# Patient Record
Sex: Female | Born: 1995 | Race: White | Hispanic: No | Marital: Single | Smoking: Former smoker
Health system: Southern US, Community
[De-identification: ages and names within clinical notes are randomized; demographics above are authoritative.]

## PROBLEM LIST (undated history)

## (undated) DIAGNOSIS — F32A Depression, unspecified: Secondary | ICD-10-CM

## (undated) DIAGNOSIS — F329 Major depressive disorder, single episode, unspecified: Secondary | ICD-10-CM

## (undated) HISTORY — PX: FRACTURE SURGERY: SHX138

## (undated) HISTORY — PX: WISDOM TOOTH EXTRACTION: SHX21

---
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
## 2015-11-10 ENCOUNTER — Ambulatory Visit
Admission: EM | Admit: 2015-11-10 | Discharge: 2015-11-10 | Disposition: A | Discharge status: Home / Self Care | Payer: BLUE CROSS/BLUE SHIELD | Attending: Family Medicine | Admitting: Family Medicine

## 2015-11-10 ENCOUNTER — Encounter: Payer: Self-pay | Admitting: *Deleted

## 2015-11-10 DIAGNOSIS — N390 Urinary tract infection, site not specified: Secondary | ICD-10-CM

## 2015-11-10 DIAGNOSIS — B3741 Candidal cystitis and urethritis: Secondary | ICD-10-CM | POA: Diagnosis not present

## 2015-11-10 LAB — URINALYSIS COMPLETE WITH MICROSCOPIC (ARMC ONLY)
BILIRUBIN URINE: NEGATIVE
Glucose, UA: NEGATIVE mg/dL
KETONES UR: NEGATIVE mg/dL
NITRITE: NEGATIVE
PROTEIN: NEGATIVE mg/dL
SPECIFIC GRAVITY, URINE: 1.015 (ref 1.005–1.030)
pH: 7 (ref 5.0–8.0)

## 2015-11-10 LAB — PREGNANCY, URINE: PREG TEST UR: NEGATIVE

## 2015-11-10 MED ORDER — FLUCONAZOLE 150 MG PO TABS: ORAL_TABLET | ORAL | Status: DC

## 2015-11-10 MED ORDER — PHENAZOPYRIDINE HCL 200 MG PO TABS: 200.0000 mg | ORAL_TABLET | Freq: Three times a day (TID) | ORAL | Status: DC

## 2015-11-10 MED ORDER — CEPHALEXIN 500 MG PO CAPS: 500.0000 mg | ORAL_CAPSULE | Freq: Two times a day (BID) | ORAL | Status: DC

## 2015-11-10 NOTE — ED Provider Notes (Signed)
CSN: [id number]     Arrival date & time 11/10/15  1713 History   First MD Initiated Contact with Patient 11/10/15 1828     Chief Complaint  Patient presents with  . Dysuria  . Urinary Urgency  . Urinary Frequency  . Fever  . Nausea  . Emesis   (Consider location/radiation/quality/duration/timing/severity/associated sxs/prior Treatment) HPI  20 year old female who presents with dysuria urinary urgency frequency and in satiety. She denies any fever or back pain . About 3 days ago. her symptoms began. [facility] she has had numerous workups for a UTI and has even seen urology who performed a CAT scan but did not find any source telling her that it was most likely from intercourse. He states that she is sexually active at the present time. Denies any vaginal discharge.     History reviewed. No pertinent past medical history. History reviewed. No pertinent past surgical history. History reviewed. No pertinent family history. Social History  Substance Use Topics  . Smoking status: Never Smoker   . Smokeless tobacco: None  . Alcohol Use: No   OB History    No data available     Review of Systems  Constitutional: Positive for activity change. Negative for fever, chills and fatigue.  Genitourinary: Positive for dysuria, urgency, frequency, decreased urine volume and difficulty urinating. Negative for hematuria, flank pain, vaginal bleeding, vaginal discharge, genital sores, vaginal pain, menstrual problem and pelvic pain.  All other systems reviewed and are negative.   Allergies  Review of patient's allergies indicates no known allergies.  Home Medications   Prior to Admission medications   Medication Sig Start Date End Date Taking? Authorizing Provider  cephALEXin (KEFLEX) 500 MG capsule Take 1 capsule (500 mg total) by mouth 2 (two) times daily. 11/10/15   Lutricia FeilWilliam P Joey Hudock, PA-C  fluconazole (DIFLUCAN) 150 MG tablet Take one tab once and repeat in 72  hours. 11/10/15   Lutricia FeilWilliam P Keria Widrig, PA-C  phenazopyridine (PYRIDIUM) 200 MG tablet Take 1 tablet (200 mg total) by mouth 3 (three) times daily. 11/10/15   Lutricia FeilWilliam P Avira Tillison, PA-C   Meds Ordered and Administered this Visit  Medications - No data to display  BP 124/72 mmHg  Pulse 82  Temp(Src) 98.3 F (36.8 C) (Oral)  Resp 16  Ht 5\' 3"  (1.6 m)  Wt 114 lb (51.71 kg)  BMI 20.20 kg/m2  SpO2 100%  LMP 10/27/2015 (Exact Date) No data found.   Physical Exam  Constitutional: She is oriented to person, place, and time. She appears well-developed and well-nourished. No distress.  HENT:  Head: Normocephalic.  Eyes: Conjunctivae are normal. Pupils are equal, round, and reactive to light.  Neck: Normal range of motion. Neck supple.  Pulmonary/Chest: Breath sounds normal. No respiratory distress. She has no wheezes. She has no rales.  Abdominal: Soft. Bowel sounds are normal. She exhibits no distension and no mass. There is no tenderness. There is no rebound and no guarding.  Musculoskeletal: Normal range of motion. She exhibits no edema or tenderness.  Neurological: She is alert and oriented to person, place, and time.  Skin: Skin is warm and dry. She is not diaphoretic.  Psychiatric: She has a normal mood and affect. Her behavior is normal. Judgment and thought content normal.  Nursing note and vitals reviewed.   ED Course  Procedures (including critical care time)  Labs Review Labs Reviewed  URINALYSIS COMPLETEWITH MICROSCOPIC [facility] ONLY) - Abnormal; Notable for the following:  APPearance CLOUDY (*)    Hgb urine dipstick 2+ (*)    Leukocytes, UA 2+ (*)    Bacteria, UA MANY (*)    Squamous Epithelial / LPF 0-5 (*)    All other components within normal limits  URINE CULTURE  PREGNANCY, URINE    Imaging Review No results found.   Visual Acuity Review  Right Eye Distance:   Left Eye Distance:   Bilateral Distance:    Right Eye Near:   Left Eye Near:    Bilateral Near:          MDM   1. UTI (lower urinary tract infection)   2. Candida cystitis    New Prescriptions   CEPHALEXIN (KEFLEX) 500 MG CAPSULE    Take 1 capsule (500 mg total) by mouth 2 (two) times daily.   FLUCONAZOLE (DIFLUCAN) 150 MG TABLET    Take one tab once and repeat in 72 hours.   PHENAZOPYRIDINE (PYRIDIUM) 200 MG TABLET    Take 1 tablet (200 mg total) by mouth 3 (three) times daily.  Plan: 1. Test/x-ray results and diagnosis reviewed with patient 2. rx as per orders; risks, benefits, potential side effects reviewed with patient 3. Recommend supportive treatment with Increase fluids. I've given her both Diflucan and Keflex for UTI and yeast. She is very familiar with all the ways to avoid UTIs. unfortunately she does not like cranberry juice. She will call in 48 hours for results of the cultures and sensitivity. She's not improving I have requested that she return to her primary care physician for follow-up. 4. F/u prn if symptoms worsen or don't improve     Lutricia Feil, PA-C 11/10/15 1922

## 2015-11-10 NOTE — Discharge Instructions (Signed)
Dysuria °Dysuria is pain or discomfort while urinating. The pain or discomfort may be felt in the tube that carries urine out of the bladder (urethra) or in the surrounding tissue of the genitals. The pain may also be felt in the groin area, lower abdomen, and lower back. You may have to urinate frequently or have the sudden feeling that you have to urinate (urgency). Dysuria can affect both men and women, but is more common in women. °Dysuria can be caused by many different things, including: °· Urinary tract infection in women. °· Infection of the kidney or bladder. °· Kidney stones or bladder stones. °· Certain sexually transmitted infections (STIs), such as chlamydia. °· Dehydration. °· Inflammation of the vagina. °· Use of certain medicines. °· Use of certain soaps or scented products that cause irritation. °HOME CARE INSTRUCTIONS °Watch your dysuria for any changes. The following actions may help to reduce any discomfort you are feeling: °· Drink enough fluid to keep your urine clear or pale yellow. °· Empty your bladder often. Avoid holding urine for long periods of time. °· After a bowel movement or urination, women should cleanse from front to back, using each tissue only once. °· Empty your bladder after sexual intercourse. °· Take medicines only as directed by your health care provider. °· If you were prescribed an antibiotic medicine, finish it all even if you start to feel better. °· Avoid caffeine, tea, and alcohol. They can irritate the bladder and make dysuria worse. In men, alcohol may irritate the prostate. °· Keep all follow-up visits as directed by your health care provider. This is important. °· If you had any tests done to find the cause of dysuria, it is your responsibility to obtain your test results. Ask the lab or department performing the test when and how you will get your results. Talk with your health care provider if you have any questions about your results. °SEEK MEDICAL CARE  IF: °· You develop pain in your back or sides. °· You have a fever. °· You have nausea or vomiting. °· You have blood in your urine. °· You are not urinating as often as you usually do. °SEEK IMMEDIATE MEDICAL CARE IF: °· You pain is severe and not relieved with medicines. °· You are unable to hold down any fluids. °· You or someone else notices a change in your mental function. °· You have a rapid heartbeat at rest. °· You have shaking or chills. °· You feel extremely weak. °  °This information is not intended to replace advice given to you by your health care provider. Make sure you discuss any questions you have with your health care provider. °  °Document Released: 03/30/2004 Document Revised: 07/23/2014 Document Reviewed: 02/25/2014 °Elsevier Interactive Patient Education ©2016 [employer] ° °Urinary Tract Infection °Urinary tract infections (UTIs) can develop anywhere along your urinary tract. Your urinary tract is your body's drainage system for removing wastes and extra water. Your urinary tract includes two kidneys, two ureters, a bladder, and a urethra. Your kidneys are a pair of bean-shaped organs. Each kidney is about the size of your fist. They are located below your ribs, one on each side of your spine. °CAUSES °Infections are caused by microbes, which are microscopic organisms, including fungi, viruses, and bacteria. These organisms are so small that they can only be seen through a microscope. Bacteria are the microbes that most commonly cause UTIs. °SYMPTOMS  °Symptoms of UTIs may vary by age and gender of the patient   and by the location of the infection. Symptoms in young women typically include a frequent and intense urge to urinate and a painful, burning feeling in the bladder or urethra during urination. Older women and men are more likely to be tired, shaky, and weak and have muscle aches and abdominal pain. A fever may mean the infection is in your kidneys. Other symptoms of a kidney  infection include pain in your back or sides below the ribs, nausea, and vomiting. °DIAGNOSIS °To diagnose a UTI, your caregiver will ask you about your symptoms. Your caregiver will also ask you to provide a urine sample. The urine sample will be tested for bacteria and white blood cells. White blood cells are made by your body to help fight infection. °TREATMENT  °Typically, UTIs can be treated with medication. Because most UTIs are caused by a bacterial infection, they usually can be treated with the use of antibiotics. The choice of antibiotic and length of treatment depend on your symptoms and the type of bacteria causing your infection. °HOME CARE INSTRUCTIONS °· If you were prescribed antibiotics, take them exactly as your caregiver instructs you. Finish the medication even if you feel better after you have only taken some of the medication. °· Drink enough water and fluids to keep your urine clear or pale yellow. °· Avoid caffeine, tea, and carbonated beverages. They tend to irritate your bladder. °· Empty your bladder often. Avoid holding urine for long periods of time. °· Empty your bladder before and after sexual intercourse. °· After a bowel movement, women should cleanse from front to back. Use each tissue only once. °SEEK MEDICAL CARE IF:  °· You have back pain. °· You develop a fever. °· Your symptoms do not begin to resolve within 3 days. °SEEK IMMEDIATE MEDICAL CARE IF:  °· You have severe back pain or lower abdominal pain. °· You develop chills. °· You have nausea or vomiting. °· You have continued burning or discomfort with urination. °MAKE SURE YOU:  °· Understand these instructions. °· Will watch your condition. °· Will get help right away if you are not doing well or get worse. °  °This information is not intended to replace advice given to you by your health care provider. Make sure you discuss any questions you have with your health care provider. °  °Document Released: 04/11/2005 Document  Revised: 03/23/2015 Document Reviewed: 08/10/2011 °Elsevier Interactive Patient Education ©2016 Elsevier Inc. ° °

## 2015-11-10 NOTE — ED Notes (Signed)
Dysuria, urinary urgency/frequency, fever, n/v, x3 days. States extensive hx of UTI with [facility].

## 2015-11-12 LAB — URINE CULTURE

## 2016-12-14 ENCOUNTER — Encounter: Payer: Self-pay | Admitting: *Deleted

## 2016-12-14 ENCOUNTER — Ambulatory Visit
Admission: EM | Admit: 2016-12-14 | Discharge: 2016-12-14 | Disposition: A | Discharge status: Home / Self Care | Payer: BLUE CROSS/BLUE SHIELD | Attending: Emergency Medicine | Admitting: Emergency Medicine

## 2016-12-14 DIAGNOSIS — N760 Acute vaginitis: Secondary | ICD-10-CM

## 2016-12-14 DIAGNOSIS — N12 Tubulo-interstitial nephritis, not specified as acute or chronic: Secondary | ICD-10-CM

## 2016-12-14 DIAGNOSIS — B9689 Other specified bacterial agents as the cause of diseases classified elsewhere: Secondary | ICD-10-CM | POA: Diagnosis not present

## 2016-12-14 HISTORY — DX: Major depressive disorder, single episode, unspecified: F32.9

## 2016-12-14 HISTORY — DX: Depression, unspecified: F32.A

## 2016-12-14 LAB — URINALYSIS, COMPLETE (UACMP) WITH MICROSCOPIC: Hgb urine dipstick: NEGATIVE

## 2016-12-14 LAB — CHLAMYDIA/NGC RT PCR (ARMC ONLY)
CHLAMYDIA TR: DETECTED — AB
N GONORRHOEAE: NOT DETECTED

## 2016-12-14 LAB — WET PREP, GENITAL
Sperm: NONE SEEN
Trich, Wet Prep: NONE SEEN
Yeast Wet Prep HPF POC: NONE SEEN

## 2016-12-14 LAB — PREGNANCY, URINE: PREG TEST UR: NEGATIVE

## 2016-12-14 MED ORDER — PHENAZOPYRIDINE HCL 200 MG PO TABS: 200.0000 mg | ORAL_TABLET | Freq: Three times a day (TID) | ORAL | 0 refills | Status: DC

## 2016-12-14 MED ORDER — ONDANSETRON 4 MG PO TBDP: 4.0000 mg | ORAL_TABLET | Freq: Three times a day (TID) | ORAL | 0 refills | Status: DC | PRN

## 2016-12-14 MED ORDER — METRONIDAZOLE 500 MG PO TABS: 500.0000 mg | ORAL_TABLET | Freq: Two times a day (BID) | ORAL | 0 refills | Status: AC

## 2016-12-14 MED ORDER — IBUPROFEN 400 MG PO TABS: 400.0000 mg | ORAL_TABLET | Freq: Four times a day (QID) | ORAL | 0 refills | Status: DC | PRN

## 2016-12-14 MED ORDER — CEFTRIAXONE SODIUM 1 G IJ SOLR
1.0000 g | Freq: Once | INTRAMUSCULAR | Status: AC
  Administered 2016-12-14: 1 g via INTRAMUSCULAR

## 2016-12-14 MED ORDER — CEPHALEXIN 500 MG PO CAPS: 500.0000 mg | ORAL_CAPSULE | Freq: Three times a day (TID) | ORAL | 0 refills | Status: AC

## 2016-12-14 NOTE — ED Triage Notes (Signed)
Patient started having RLQ abdominal pain 3 days ago. Additional symptoms of nausea and vomiting are present.

## 2016-12-14 NOTE — ED Provider Notes (Signed)
HPI  SUBJECTIVE:  Leah Bender is a 21 y.o. female who presents with right-sided low back pain, dysuria, urgency, frequency, cloudy and odorous urine, bilateral pelvic pain for the past 3 days. She describes the back pain as stabbing, intermittent, present with walking and getting up only. She states that the pelvic pain is throbbing, present with the back pain only. She reports 2 episodes of nausea and vomiting today states that she is unable tolerate any by mouth today. She denies any other abdominal pain. She tried Azo with some improvement in her symptoms. Symptoms are worse with walking. No fevers, hematuria. No vaginal bleeding, discharge, general rash, itching. She is in a relatively new monogamous relationship with a female who is asymptomatic. They use condoms inconsistently. STDs are not a concern today. She has a past medical history frequent UTIs. No history of pyelonephritis, nephrolithiasis. Also a history of yeast infections. No history of PID, ectopic pregnancy, gonorrhea, chlamydia, herpes, HIV, syphilis, Trichomonas, BV, abdominal surgeries, diabetes or hypertension. LMP: 5/27. [facility]   Last urine culture done on 10/2015, was negative. Patient had a yeast infection.  Past Medical History:  Diagnosis Date  . Depression     History reviewed. No pertinent surgical history.  History reviewed. No pertinent family history.  Social History  Substance Use Topics  . Smoking status: Never Smoker  . Smokeless tobacco: Never Used  . Alcohol use No    No current facility-administered medications for this encounter.   Current Outpatient Prescriptions:  .  doxepin (SINEQUAN) 25 MG capsule, Take 25 mg by mouth at bedtime., Disp: , Rfl:  .  norethindrone-ethinyl estradiol-iron (ESTROSTEP FE,TILIA FE,TRI-LEGEST FE) 1-20/1-30/1-35 MG-MCG tablet, Take 1 tablet by mouth daily., Disp: , Rfl:  .  cephALEXin (KEFLEX) 500 MG capsule, Take 1 capsule (500 mg total) by  mouth 3 (three) times daily. X 14 days, Disp: 30 capsule, Rfl: 0 .  ibuprofen (ADVIL,MOTRIN) 400 MG tablet, Take 1 tablet (400 mg total) by mouth every 6 (six) hours as needed., Disp: 30 tablet, Rfl: 0 .  metroNIDAZOLE (FLAGYL) 500 MG tablet, Take 1 tablet (500 mg total) by mouth 2 (two) times daily., Disp: 14 tablet, Rfl: 0 .  ondansetron (ZOFRAN ODT) 4 MG disintegrating tablet, Take 1 tablet (4 mg total) by mouth every 8 (eight) hours as needed for nausea or vomiting., Disp: 20 tablet, Rfl: 0 .  phenazopyridine (PYRIDIUM) 200 MG tablet, Take 1 tablet (200 mg total) by mouth 3 (three) times daily., Disp: 6 tablet, Rfl: 0  No Known Allergies   ROS  As noted in HPI.   Physical Exam  BP 120/69 (BP Location: Left Arm)   Pulse 90   Temp 98.2 F (36.8 C) (Oral)   Resp 16   Ht 5\' 2"  (1.575 m)   Wt 106 lb (48.1 kg)   LMP 12/11/2016   SpO2 100%   BMI 19.39 kg/m   Constitutional: Well developed, well nourished, no acute distress Eyes:  EOMI, conjunctiva normal bilaterally HENT: Normocephalic, atraumatic,mucus membranes moist Respiratory: Normal inspiratory effort Cardiovascular: Normal rate GI: nondistended. Soft. Positive suprapubic tenderness. Positive left lower quadrant tenderness. No flank tenderness. No other tenderness. Negative McBurney.  Back: questionable CVA tenderness- states is "sore"  GU: normal external genitalia. Positive mucoid white nonodorous vaginal discharge. Normal os. No CMT. Uterus smooth, nontender. No adnexal tenderness, masses. Chaperone present during exam. skin: No rash, skin intact Musculoskeletal: no deformities Neurologic: Alert & oriented x 3, no focal  neuro deficits Psychiatric: Speech and behavior appropriate   ED Course   Medications  cefTRIAXone (ROCEPHIN) injection 1 g (1 g Intramuscular Given 12/14/16 1636)    Orders Placed This Encounter  Procedures  . Pelvic exam    Standing Status:   Standing    Number of Occurrences:   1  . Urine  culture    Standing Status:   Standing    Number of Occurrences:   1  . Chlamydia/NGC rt PCR    Standing Status:   Standing    Number of Occurrences:   1    Order Specific Question:   Patient immune status    Answer:   Normal  . Wet prep, genital    Standing Status:   Standing    Number of Occurrences:   1    Order Specific Question:   Patient immune status    Answer:   Normal  . Urinalysis, Complete w Microscopic    Standing Status:   Standing    Number of Occurrences:   1  . Pregnancy, urine    Standing Status:   Standing    Number of Occurrences:   1    Results for orders placed or performed during the hospital encounter of 12/14/16 (from the past 24 hour(s))  Urinalysis, Complete w Microscopic     Status: Abnormal   Collection Time: 12/14/16  3:33 PM  Result Value Ref Range   Color, Urine ORANGE (A) YELLOW   APPearance CLOUDY (A) CLEAR   Specific Gravity, Urine  1.005 - 1.030    TEST NOT REPORTED DUE TO COLOR INTERFERENCE OF URINE PIGMENT   pH  5.0 - 8.0    TEST NOT REPORTED DUE TO COLOR INTERFERENCE OF URINE PIGMENT   Glucose, UA (A) NEGATIVE mg/dL    TEST NOT REPORTED DUE TO COLOR INTERFERENCE OF URINE PIGMENT   Hgb urine dipstick NEGATIVE NEGATIVE   Bilirubin Urine (A) NEGATIVE    TEST NOT REPORTED DUE TO COLOR INTERFERENCE OF URINE PIGMENT   Ketones, ur (A) NEGATIVE mg/dL    TEST NOT REPORTED DUE TO COLOR INTERFERENCE OF URINE PIGMENT   Protein, ur (A) NEGATIVE mg/dL    TEST NOT REPORTED DUE TO COLOR INTERFERENCE OF URINE PIGMENT   Nitrite (A) NEGATIVE    TEST NOT REPORTED DUE TO COLOR INTERFERENCE OF URINE PIGMENT   Leukocytes, UA (A) NEGATIVE    TEST NOT REPORTED DUE TO COLOR INTERFERENCE OF URINE PIGMENT   Squamous Epithelial / LPF 6-30 (A) NONE SEEN   WBC, UA 21-50 0 - 5 WBC/hpf   RBC / HPF TOO NUMEROUS TO COUNT 0 - 5 RBC/hpf   Bacteria, UA MANY (A) NONE SEEN  Pregnancy, urine     Status: None   Collection Time: 12/14/16  3:41 PM  Result Value Ref Range    Preg Test, Ur NEGATIVE NEGATIVE  Wet prep, genital     Status: Abnormal   Collection Time: 12/14/16  4:23 PM  Result Value Ref Range   Yeast Wet Prep HPF POC NONE SEEN NONE SEEN   Trich, Wet Prep NONE SEEN NONE SEEN   Clue Cells Wet Prep HPF POC PRESENT (A) NONE SEEN   WBC, Wet Prep HPF POC MANY (A) NONE SEEN   Sperm NONE SEEN    No results found.  ED Clinical Impression  Bacterial vaginosis  Pyelonephritis   ED Assessment/Plan  Urine pregnancy negative. Urine contaminated with epithelial cells, but has hematuria, pyuria, many bacteria. Patient  has color interference so full urinalysis not done.  She declined testing for HIV or syphilis states that she recently had this done and it was negative.  Presentation most consistent with UTI concerning for early pyelonephritis. She has no evidence of PID. She is not pregnant. Gonorrhea, chlamydia sent.   wet prep positive for BV, negative for yeast, trichomoniasis.. Will give 1 g of Rocephin, home with Flagyl, Keflex, Pyridium, and Zofran. Sending urine off for culture. We'll also send home with ibuprofen 400 mg 1 g of Tylenol.  Previous labs, chart reviewed. As noted in history of present illness.  Discussed labs, MDM, plan and followup with patient. Discussed sn/sx that should prompt return to the ED. Patient agrees with plan.   Meds ordered this encounter  Medications  . norethindrone-ethinyl estradiol-iron (ESTROSTEP FE,TILIA FE,TRI-LEGEST FE) 1-20/1-30/1-35 MG-MCG tablet    Sig: Take 1 tablet by mouth daily.  Marland Kitchen doxepin (SINEQUAN) 25 MG capsule    Sig: Take 25 mg by mouth at bedtime.  . cefTRIAXone (ROCEPHIN) injection 1 g  . phenazopyridine (PYRIDIUM) 200 MG tablet    Sig: Take 1 tablet (200 mg total) by mouth 3 (three) times daily.    Dispense:  6 tablet    Refill:  0  . metroNIDAZOLE (FLAGYL) 500 MG tablet    Sig: Take 1 tablet (500 mg total) by mouth 2 (two) times daily.    Dispense:  14 tablet    Refill:  0  .  cephALEXin (KEFLEX) 500 MG capsule    Sig: Take 1 capsule (500 mg total) by mouth 3 (three) times daily. X 14 days    Dispense:  30 capsule    Refill:  0  . ondansetron (ZOFRAN ODT) 4 MG disintegrating tablet    Sig: Take 1 tablet (4 mg total) by mouth every 8 (eight) hours as needed for nausea or vomiting.    Dispense:  20 tablet    Refill:  0  . ibuprofen (ADVIL,MOTRIN) 400 MG tablet    Sig: Take 1 tablet (400 mg total) by mouth every 6 (six) hours as needed.    Dispense:  30 tablet    Refill:  0    *This clinic note was created using Scientist, clinical (histocompatibility and immunogenetics). Therefore, there may be occasional mistakes despite careful proofreading.  ?   Domenick Gong, MD 12/14/16 1659

## 2016-12-14 NOTE — Discharge Instructions (Signed)
Take the medication as written. Give us a working phone number so that we can contact you if needed. Refrain from sexual contact until you know your results and your partner(s) are treated if necessary. 1 g of Tylenol 400 mg of ibuprofen 4 times a day as needed for pain. Return to the ER  if you get worse, have a fever >100.4, or for any concerns, and for the signs and symptoms we discussed  Go to www.goodrx.com to look up your medications. This will give you a list of where you can find your prescriptions at the most affordable prices.

## 2016-12-16 ENCOUNTER — Telehealth (HOSPITAL_COMMUNITY): Payer: Self-pay | Admitting: Internal Medicine

## 2016-12-16 MED ORDER — AZITHROMYCIN 500 MG PO TABS: 1000.0000 mg | ORAL_TABLET | Freq: Once | ORAL | 0 refills | Status: AC

## 2016-12-16 NOTE — Telephone Encounter (Signed)
Clinical staff, please let patient and health department know that test for chlamydia was positive.  Rx zithromax sent to pharmacy of record, [facility] in [place].  Sexual partners need to be notified and tested/treated.  Condoms may reduce risk of reinfection.   Final urine culture results still pending.  LM

## 2016-12-17 LAB — URINE CULTURE

## 2016-12-18 ENCOUNTER — Telehealth: Payer: Self-pay

## 2016-12-18 NOTE — Telephone Encounter (Signed)
Pt calls in regarding her positive chlamydia results that we called to her yesterday and she has questions for the provider. Wants to know how quickly after being infected symptoms will show up. Also wants to know if she has any restrictions with the diagnosis. Provider please advise.

## 2016-12-19 NOTE — Telephone Encounter (Signed)
Generally symptoms appear in the week or two after infection, although some infections are long-standing.  Would abstain from intercourse for a week after treatment.  For treatment to be effective, partner must be treated also.  Condoms reduce the risk of infection/re-infection but are not 100% effective.  Recheck as needed.  LM

## 2017-04-09 ENCOUNTER — Ambulatory Visit
Admission: EM | Admit: 2017-04-09 | Discharge: 2017-04-09 | Disposition: A | Discharge status: Home / Self Care | Payer: BLUE CROSS/BLUE SHIELD | Attending: Emergency Medicine | Admitting: Emergency Medicine

## 2017-04-09 ENCOUNTER — Encounter: Payer: Self-pay | Admitting: Emergency Medicine

## 2017-04-09 DIAGNOSIS — M545 Low back pain: Secondary | ICD-10-CM | POA: Diagnosis not present

## 2017-04-09 DIAGNOSIS — B9689 Other specified bacterial agents as the cause of diseases classified elsewhere: Secondary | ICD-10-CM | POA: Diagnosis not present

## 2017-04-09 DIAGNOSIS — Z113 Encounter for screening for infections with a predominantly sexual mode of transmission: Secondary | ICD-10-CM | POA: Diagnosis not present

## 2017-04-09 DIAGNOSIS — R102 Pelvic and perineal pain: Secondary | ICD-10-CM

## 2017-04-09 DIAGNOSIS — Z3202 Encounter for pregnancy test, result negative: Secondary | ICD-10-CM

## 2017-04-09 DIAGNOSIS — N390 Urinary tract infection, site not specified: Secondary | ICD-10-CM

## 2017-04-09 DIAGNOSIS — R319 Hematuria, unspecified: Secondary | ICD-10-CM

## 2017-04-09 DIAGNOSIS — N760 Acute vaginitis: Secondary | ICD-10-CM

## 2017-04-09 DIAGNOSIS — R300 Dysuria: Secondary | ICD-10-CM

## 2017-04-09 LAB — URINALYSIS, COMPLETE (UACMP) WITH MICROSCOPIC
BILIRUBIN URINE: NEGATIVE
Glucose, UA: NEGATIVE mg/dL
KETONES UR: NEGATIVE mg/dL
NITRITE: NEGATIVE
PROTEIN: NEGATIVE mg/dL
SPECIFIC GRAVITY, URINE: 1.015 (ref 1.005–1.030)
pH: 6.5 (ref 5.0–8.0)

## 2017-04-09 LAB — WET PREP, GENITAL
Sperm: NONE SEEN
Trich, Wet Prep: NONE SEEN
YEAST WET PREP: NONE SEEN

## 2017-04-09 LAB — CHLAMYDIA/NGC RT PCR (ARMC ONLY)
Chlamydia Tr: NOT DETECTED
N gonorrhoeae: NOT DETECTED

## 2017-04-09 LAB — PREGNANCY, URINE: PREG TEST UR: NEGATIVE

## 2017-04-09 MED ORDER — PHENAZOPYRIDINE HCL 200 MG PO TABS: 200.0000 mg | ORAL_TABLET | Freq: Three times a day (TID) | ORAL | 0 refills | Status: DC | PRN

## 2017-04-09 MED ORDER — CEFTRIAXONE SODIUM 250 MG IJ SOLR
250.0000 mg | Freq: Once | INTRAMUSCULAR | Status: AC
  Administered 2017-04-09: 250 mg via INTRAMUSCULAR

## 2017-04-09 MED ORDER — NITROFURANTOIN MONOHYD MACRO 100 MG PO CAPS: 100.0000 mg | ORAL_CAPSULE | Freq: Two times a day (BID) | ORAL | 0 refills | Status: DC

## 2017-04-09 MED ORDER — AZITHROMYCIN 500 MG PO TABS
1000.0000 mg | ORAL_TABLET | Freq: Once | ORAL | Status: AC
  Administered 2017-04-09: 1000 mg via ORAL

## 2017-04-09 MED ORDER — METRONIDAZOLE 500 MG PO TABS: 500.0000 mg | ORAL_TABLET | Freq: Two times a day (BID) | ORAL | 0 refills | Status: AC

## 2017-04-09 NOTE — Discharge Instructions (Signed)
Take the medication as written. Give us a working phone number so that we can contact you if needed. Refrain from sexual contact until you know your results and your partner(s) are treated if necessary. Return to the ER if you get worse, have a fever >100.4, or for any concerns.  ° °Go to www.goodrx.com to look up your medications. This will give you a list of where you can find your prescriptions at the most affordable prices. Or ask the pharmacist what the cash price is, or if they have any other discount programs available to help make your medication more affordable. This can be less expensive than what you would pay with insurance.  ° °

## 2017-04-09 NOTE — ED Provider Notes (Signed)
HPI  SUBJECTIVE:  Leah Bender is a 21 y.o. female who presents with pelvic pain, pressure for the past 5 days. She reports dysuria, urgency, cloudy and odorous urine. No frequency or hematuria. She reports mild bilateral low back pain described as soreness. No vomiting, fevers, vaginal bleeding, discharge, odor, itching, genital rash. She is sexually active with a new female partner and they use condoms inconsistently. He is asymptomatic, however STDs are a concern today. No recent antibiotics. She is using summer's eve body wash. She tried increasing her fluids with some improvement in her symptoms, symptoms are worse with sodas.  Patient was seen here in June for back pain, pelvic pain and urinary complaints, found to have a UTI, BV. She was given a gram of Rocephin for possible early pyelonephritis and sent home with Keflex and Flagyl. She also tested positive for chlamydia which was treated. She has a past medical history of frequent UTIs, no history of nephrolithiasis. No history of gonorrhea, HIV, HSV, syphilis, Trichomonas, yeast. No history of diabetes, hypertension, ectopic pregnancy, PID. LMP: Last Thursday. PMD: Dr. Georga Hacking at [facility] pediatric[facility].  Past Medical History:  Diagnosis Date  . Depression     History reviewed. No pertinent surgical history.  Family History  Problem Relation Age of Onset  . Rheum arthritis Mother     Social History  Substance Use Topics  . Smoking status: Never Smoker  . Smokeless tobacco: Never Used  . Alcohol use Yes     Comment: socially    No current facility-administered medications for this encounter.   Current Outpatient Prescriptions:  .  escitalopram (LEXAPRO) 10 MG tablet, Take 10 mg by mouth daily., Disp: , Rfl:  .  ibuprofen (ADVIL,MOTRIN) 400 MG tablet, Take 1 tablet (400 mg total) by mouth every 6 (six) hours as needed., Disp: 30 tablet, Rfl: 0 .  norethindrone-ethinyl estradiol-iron (ESTROSTEP FE,TILIA FE,TRI-LEGEST FE) 1-20/1-30/1-35  MG-MCG tablet, Take 1 tablet by mouth daily., Disp: , Rfl:  .  metroNIDAZOLE (FLAGYL) 500 MG tablet, Take 1 tablet (500 mg total) by mouth 2 (two) times daily., Disp: 14 tablet, Rfl: 0 .  nitrofurantoin, macrocrystal-monohydrate, (MACROBID) 100 MG capsule, Take 1 capsule (100 mg total) by mouth 2 (two) times daily. X 5 days, Disp: 10 capsule, Rfl: 0 .  phenazopyridine (PYRIDIUM) 200 MG tablet, Take 1 tablet (200 mg total) by mouth 3 (three) times daily as needed for pain., Disp: 6 tablet, Rfl: 0  No Known Allergies   ROS  As noted in HPI.   Physical Exam  BP (!) 114/59 (BP Location: Left Arm)   Pulse 76   Temp 98.2 F (36.8 C) (Oral)   Resp 16   Ht  (1.6 m)   Wt 105 lb (47.6 kg)   LMP 04/03/2017 (Exact Date)   SpO2 100%   BMI 18.60 kg/m   Constitutional: Well developed, well nourished, no acute distress Eyes:  EOMI, conjunctiva normal bilaterally HENT: Normocephalic, atraumatic,mucus membranes moist Respiratory: Normal inspiratory effort Cardiovascular: Normal rate GI: nondistended. Soft. Positive suprapubic tenderness. No flank tenderness. inegative McBurney's.  Back: No CVA tenderness GU: Normal external genitalia, no rashes or swelling. Positive thick white nonodorous vaginal discharge. Normal os. Normal vaginal mucosa. Uterus smooth, nontender, no adnexal tenderness or masses. Chaperone present during exam. skin: No rash, skin intact Musculoskeletal: no deformities Neurologic: Alert & oriented x 3, no focal neuro deficits Psychiatric: Speech and behavior appropriate   ED Course   Medications  cefTRIAXone (ROCEPHIN) injection 250 mg (250  mg Intramuscular Given 04/09/17 1903)  azithromycin (ZITHROMAX) tablet 1,000 mg (1,000 mg Oral Given 04/09/17 1902)    Orders Placed This Encounter  Procedures  . Chlamydia/NGC rt PCR    Standing Status:   Standing    Number of Occurrences:   1    Order Specific Question:   Patient immune status    Answer:   Normal  . Wet  prep, genital    Standing Status:   Standing    Number of Occurrences:   1    Order Specific Question:   Patient immune status    Answer:   Normal  . Urine culture    Standing Status:   Standing    Number of Occurrences:   1    Order Specific Question:   List patient's active antibiotics    Answer:   macrobid  . Urinalysis, Complete w Microscopic    Standing Status:   Standing    Number of Occurrences:   1  . Pregnancy, urine    Standing Status:   Standing    Number of Occurrences:   1  . RPR    Standing Status:   Standing    Number of Occurrences:   1  . HIV antibody    Standing Status:   Standing    Number of Occurrences:   1    Results for orders placed or performed during the hospital encounter of 04/09/17 (from the past 24 hour(s))  Urinalysis, Complete w Microscopic     Status: Abnormal   Collection Time: 04/09/17  5:42 PM  Result Value Ref Range   Color, Urine YELLOW YELLOW   APPearance CLEAR CLEAR   Specific Gravity, Urine 1.015 1.005 - 1.030   pH 6.5 5.0 - 8.0   Glucose, UA NEGATIVE NEGATIVE mg/dL   Hgb urine dipstick SMALL (A) NEGATIVE   Bilirubin Urine NEGATIVE NEGATIVE   Ketones, ur NEGATIVE NEGATIVE mg/dL   Protein, ur NEGATIVE NEGATIVE mg/dL   Nitrite NEGATIVE NEGATIVE   Leukocytes, UA SMALL (A) NEGATIVE   Squamous Epithelial / LPF 6-30 (A) NONE SEEN   WBC, UA 21-50 0 - 5 WBC/hpf   RBC / HPF 0-5 0 - 5 RBC/hpf   Bacteria, UA MANY (A) NONE SEEN  Pregnancy, urine     Status: None   Collection Time: 04/09/17  5:42 PM  Result Value Ref Range   Preg Test, Ur NEGATIVE NEGATIVE  Wet prep, genital     Status: Abnormal   Collection Time: 04/09/17  6:44 PM  Result Value Ref Range   Yeast Wet Prep HPF POC NONE SEEN NONE SEEN   Trich, Wet Prep NONE SEEN NONE SEEN   Clue Cells Wet Prep HPF POC PRESENT (A) NONE SEEN   WBC, Wet Prep HPF POC FEW (A) NONE SEEN   Sperm NONE SEEN    No results found.  ED Clinical Impression  BV (bacterial vaginosis)  Urinary  tract infection with hematuria, site unspecified   ED Assessment/Plan  Previous records and labs reviewed. As noted in history of present illness.  Urine pregnancy negative. UA noted, suggestive of UTI with many bacteria however this is a contaminated specimen. However she does have suprapubic tenderness, no evidence of PID. No evidence of pyelonephritis. Home with Pyridium and Macrobid. We'll send urine off for culture to confirm antibiotic choice. We'll treat empirically for gonorrhea and chlamydia today with 1 g of azithromycin and 250 mg of Rocephin. Also checking HIV, RPR. GC chlamydia sent.  wet prep positive for BV. Home with Flagyl.   Discussed labs,, MDM, plan and followup with patient. Discussed sn/sx that should prompt return to the ED. Patient agrees with plan.   Meds ordered this encounter  Medications  . escitalopram (LEXAPRO) 10 MG tablet    Sig: Take 10 mg by mouth daily.  . cefTRIAXone (ROCEPHIN) injection 250 mg    Order Specific Question:   Antibiotic Indication:    Answer:   STD  . azithromycin (ZITHROMAX) tablet 1,000 mg  . nitrofurantoin, macrocrystal-monohydrate, (MACROBID) 100 MG capsule    Sig: Take 1 capsule (100 mg total) by mouth 2 (two) times daily. X 5 days    Dispense:  10 capsule    Refill:  0  . phenazopyridine (PYRIDIUM) 200 MG tablet    Sig: Take 1 tablet (200 mg total) by mouth 3 (three) times daily as needed for pain.    Dispense:  6 tablet    Refill:  0  . metroNIDAZOLE (FLAGYL) 500 MG tablet    Sig: Take 1 tablet (500 mg total) by mouth 2 (two) times daily.    Dispense:  14 tablet    Refill:  0    *This clinic note was created using Scientist, clinical (histocompatibility and immunogenetics). Therefore, there may be occasional mistakes despite careful proofreading.  ?   Domenick Gong, MD 04/09/17 2130

## 2017-04-09 NOTE — ED Triage Notes (Signed)
Patient c/o pain after urination and a foul odor x 5 days. Patient denies urinary frequency or fever. Patient also c/o diarrhea and abdominal pain x 3-4 days.

## 2017-04-11 LAB — HIV ANTIBODY (ROUTINE TESTING W REFLEX): HIV Screen 4th Generation wRfx: NONREACTIVE

## 2017-04-11 LAB — RPR: RPR Ser Ql: NONREACTIVE

## 2017-04-12 LAB — URINE CULTURE: Culture: >=100000 — AB

## 2018-02-04 ENCOUNTER — Ambulatory Visit
Admission: EM | Admit: 2018-02-04 | Discharge: 2018-02-04 | Disposition: A | Discharge status: Home / Self Care | Payer: BLUE CROSS/BLUE SHIELD | Attending: Family Medicine | Admitting: Family Medicine

## 2018-02-04 DIAGNOSIS — G43009 Migraine without aura, not intractable, without status migrainosus: Secondary | ICD-10-CM

## 2018-02-04 MED ORDER — KETOROLAC TROMETHAMINE 60 MG/2ML IM SOLN
60.0000 mg | Freq: Once | INTRAMUSCULAR | Status: AC
  Administered 2018-02-04: 60 mg via INTRAMUSCULAR

## 2018-02-04 MED ORDER — SUMATRIPTAN SUCCINATE 50 MG PO TABS: ORAL_TABLET | ORAL | 0 refills | Status: AC

## 2018-02-04 NOTE — ED Triage Notes (Signed)
Pt doesn't have a hx of migraines, did start getting one this week and has been going on all week off and on. States when she has one she gets a hot flash as well. Has been taking tylenol and ibuprofen without relief.

## 2018-02-04 NOTE — ED Provider Notes (Signed)
[facility]  CSN: [id number] Arrival date & time: 02/04/18  1053  History   Chief Complaint Chief Complaint  Patient presents with  . Migraine   HPI  22 year old female presents with a migraine headache.  Patient states that she has had a migraine as of yesterday.  Located bitemporal.  She states that she has had one previously.  Some nausea.  Photophobia.  Phonophobia.  She has used Tylenol and Advil without resolution.  No known relieving factors.  Currently 7/10 in severity.  No other associated symptoms.  No other complaints.  Social History Social History   Tobacco Use  . Smoking status: Never Smoker  . Smokeless tobacco: Never Used  Substance Use Topics  . Alcohol use: Yes    Comment: socially  . Drug use: No   Allergies   Patient has no known allergies.  Review of Systems Review of Systems  Constitutional: Negative.   Eyes: Positive for photophobia.  Gastrointestinal: Positive for nausea.  Neurological: Positive for headaches.   Physical Exam Triage Vital Signs ED Triage Vitals  Enc Vitals Group     BP 02/04/18 1147 (!) 117/59     Pulse Rate 02/04/18 1147 71     Resp 02/04/18 1147 18     Temp 02/04/18 1147 98.2 F (36.8 C)     Temp Source 02/04/18 1147 Oral     SpO2 02/04/18 1147 100 %     Weight 02/04/18 1150 119 lb (54 kg)     Height --      Head Circumference --      Peak Flow --      Pain Score 02/04/18 1150 7     Pain Loc --      Pain Edu? --      Excl. in GC? --    Updated Vital Signs BP (!) 117/59 (BP Location: Left Arm)   Pulse 71   Temp 98.2 F (36.8 C) (Oral)   Resp 18   Wt 119 lb (54 kg)   LMP 02/01/2018 (Exact Date)   SpO2 100%   BMI 21.08 kg/m   Visual Acuity Right Eye Distance:   Left Eye Distance:   Bilateral Distance:    Right Eye Near:   Left Eye Near:    Bilateral Near:     Physical Exam  Constitutional: She is oriented to person, place, and time. She appears well-developed. No distress.  HENT:    Head: Normocephalic and atraumatic.  Cardiovascular: Normal rate and regular rhythm.  Pulmonary/Chest: Effort normal and breath sounds normal. She has no wheezes. She has no rales.  Neurological: She is alert and oriented to person, place, and time.  Psychiatric: She has a normal mood and affect. Her behavior is normal.  Nursing note and vitals reviewed.  UC Treatments / Results  Labs (all labs ordered are listed, but only abnormal results are displayed) Labs Reviewed - No data to display  EKG None  Radiology No results found.  Procedures Procedures (including critical care time)  Medications Ordered in UC Medications  ketorolac (TORADOL) injection 60 mg (60 mg Intramuscular Given 02/04/18 1219)    Initial Impression / Assessment and Plan / UC Course  I have reviewed the triage vital signs and the nursing notes.  Pertinent labs & imaging results that were available during my care of the patient were reviewed by me and considered in my medical decision making (see chart for details).    22 year old female presents with migraine headache.  Toradol  given today.  Sending home on Imitrex.  Final Clinical Impressions(s) / UC Diagnoses   Final diagnoses:  Migraine without aura and without status migrainosus, not intractable   Discharge Instructions   None    ED Prescriptions    Medication Sig Dispense Auth. Provider   SUMAtriptan (IMITREX) 50 MG tablet 1 tablet at the onset of migraine. Additional dose in 2 hours if headache persists or recurs. 10 tablet Tommie Sams, DO     Controlled Substance Prescriptions [place] Controlled Substance Registry consulted? Not Applicable   Tommie Sams, DO 02/04/18 1334

## 2018-06-24 ENCOUNTER — Ambulatory Visit
Admission: EM | Admit: 2018-06-24 | Discharge: 2018-06-24 | Disposition: A | Discharge status: Home / Self Care | Payer: BLUE CROSS/BLUE SHIELD | Attending: Family Medicine | Admitting: Family Medicine

## 2018-06-24 ENCOUNTER — Encounter: Payer: Self-pay | Admitting: Emergency Medicine

## 2018-06-24 ENCOUNTER — Other Ambulatory Visit: Payer: Self-pay

## 2018-06-24 DIAGNOSIS — J111 Influenza due to unidentified influenza virus with other respiratory manifestations: Secondary | ICD-10-CM

## 2018-06-24 DIAGNOSIS — R69 Illness, unspecified: Secondary | ICD-10-CM | POA: Diagnosis not present

## 2018-06-24 LAB — RAPID STREP SCREEN (MED CTR MEBANE ONLY): Streptococcus, Group A Screen (Direct): NEGATIVE

## 2018-06-24 MED ORDER — OSELTAMIVIR PHOSPHATE 75 MG PO CAPS: 75.0000 mg | ORAL_CAPSULE | Freq: Two times a day (BID) | ORAL | 0 refills | Status: DC

## 2018-06-24 NOTE — ED Provider Notes (Signed)
[facility] ____________________________________________  Time seen: Approximately 2:05 PM  I have reviewed the triage vital signs and the nursing notes.   HISTORY  Chief Complaint Sore Throat   HPI Dahlia ByesKayla D Orlov is a 22 y.o. female presenting for evaluation of 2 days of sore throat, runny nose, nasal congestion, cough and some body aches.  States that she has felt very hot and has been sweating but denies known fevers, has not measured.  Has taken some over-the-counter cough and congestion medication as well as ibuprofen without resolution.  States sore throat currently is moderate.  Reports her sister recently has had a sore throat denies other known sick contacts.  Has continue to drink fluids well, decreased appetite.  Did feel nauseated this morning, but no vomiting, no abdominal pain and no diarrhea.  Has continue to remain active.  Denies chest pain or shortness of breath.  Denies other aggravating alleviating factors.  Reports otherwise doing well.  Patient's last menstrual period was 06/10/2018 (approximate).  Depo-Provera.  Denies pregnancy.    Past Medical History:  Diagnosis Date  . Depression     There are no active problems to display for this patient.   Past Surgical History:  Procedure Laterality Date  . FRACTURE SURGERY Right    ankle  . WISDOM TOOTH EXTRACTION       No current facility-administered medications for this encounter.   Current Outpatient Medications:  .  medroxyPROGESTERone (DEPO-PROVERA) 150 MG/ML injection, Inject 150 mg into the muscle every 3 (three) months., Disp: , Rfl:  .  sertraline (ZOLOFT) 50 MG tablet, TAKE 1/2 TABLET DAILY FOR 1 WEEK THEN TAKE 1 TABLET DAILY THEREAFTER, Disp: , Rfl: 1 .  SUMAtriptan (IMITREX) 50 MG tablet, 1 tablet at the onset of migraine. Additional dose in 2 hours if headache persists or recurs., Disp: 10 tablet, Rfl: 0 .  oseltamivir (TAMIFLU) 75 MG capsule, Take 1 capsule (75 mg total) by mouth  every 12 (twelve) hours., Disp: 10 capsule, Rfl: 0  Allergies Patient has no known allergies.  Family History  Problem Relation Age of Onset  . Rheum arthritis Mother   . Lupus Mother   . Healthy Father     Social History Social History   Tobacco Use  . Smoking status: Former Smoker    Last attempt to quit: 06/25/2015    Years since quitting: 3.0  . Smokeless tobacco: Never Used  Substance Use Topics  . Alcohol use: Yes    Comment: rarely  . Drug use: No    Review of Systems Constitutional: Positive chills and body aches. ENT: Positive sore throat. Cardiovascular: Denies chest pain. Respiratory: Denies shortness of breath. Gastrointestinal: No abdominal pain.   Genitourinary: Negative for dysuria. Musculoskeletal: Negative for back pain. Skin: Negative for rash.  ____________________________________________   PHYSICAL EXAM:  VITAL SIGNS: ED Triage Vitals  Enc Vitals Group     BP 06/24/18 1328 117/88     Pulse Rate 06/24/18 1328 88     Resp 06/24/18 1328 16     Temp 06/24/18 1328 98.3 F (36.8 C)     Temp Source 06/24/18 1328 Oral     SpO2 06/24/18 1328 100 %     Weight 06/24/18 1328 116 lb (52.6 kg)     Height 06/24/18 1328 5\' 3"  (1.6 m)     Head Circumference --      Peak Flow --      Pain Score 06/24/18 1327 6     Pain Loc --  Pain Edu? --      Excl. in GC? --     Constitutional: Alert and oriented. Well appearing and in no acute distress. Eyes: Conjunctivae are normal.  Head: Atraumatic. No sinus tenderness to palpation. No swelling. No erythema.  Ears: no erythema, normal TMs bilaterally.   Nose:Nasal congestion with clear rhinorrhea  Mouth/Throat: Mucous membranes are moist. Mild pharyngeal erythema. No tonsillar swelling or exudate.  Neck: No stridor.  No cervical spine tenderness to palpation. Hematological/Lymphatic/Immunilogical: No cervical lymphadenopathy. Cardiovascular: Normal rate, regular rhythm. Grossly normal heart sounds.   Good peripheral circulation. Respiratory: Normal respiratory effort.  No retractions. No wheezes, rales or rhonchi. Good air movement.  Gastrointestinal: Soft and nontender.  Musculoskeletal: Ambulatory with steady gait. No cervical, thoracic or lumbar tenderness to palpation. Neurologic:  Normal speech and language. No gait instability. Skin:  Skin appears warm, dry and intact. No rash noted. Psychiatric: Mood and affect are normal. Speech and behavior are normal. ___________________________________________   LABS (all labs ordered are listed, but only abnormal results are displayed)  Labs Reviewed  RAPID STREP SCREEN ([facility] ONLY)  CULTURE, GROUP A STREP [facility])     PROCEDURES Procedures    INITIAL IMPRESSION / ASSESSMENT AND PLAN / ED COURSE  Pertinent labs & imaging results that were available during my care of the patient were reviewed by me and considered in my medical decision making (see chart for details).  Overall well-appearing patient.  No acute distress.  Quick strep negative, will culture.  Suspect influenza-like illness.  2 days of symptoms.  Discussed Tamiflu treatment, Rx given.  Encouraged rest, fluids, over-the-counter Tylenol, ibuprofen and congestion medication.  Supportive care.Discussed indication, risks and benefits of medications with patient.  Discussed follow up with Primary care physician this week as needed. Discussed follow up and return parameters including no resolution or any worsening concerns. Patient verbalized understanding and agreed to plan.   ____________________________________________   FINAL CLINICAL IMPRESSION(S) / ED DIAGNOSES  Final diagnoses:  Influenza-like illness     ED Discharge Orders         Ordered    oseltamivir (TAMIFLU) 75 MG capsule  Every 12 hours     06/24/18 1357           Note: This dictation was prepared with Dragon dictation along with smaller phrase technology. Any transcriptional errors that  result from this process are unintentional.         Renford Dills, NP 06/24/18 1407

## 2018-06-24 NOTE — ED Triage Notes (Signed)
Patient in today c/o sore throat x 2 days. Patient has had sweats, but hasn't taken her temperature. Patient has tried OTC Nyquil.

## 2018-06-24 NOTE — Discharge Instructions (Signed)
Take medication as prescribed. Rest. Drink plenty of fluids.  ° °Follow up with your primary care physician this week as needed. Return to Urgent care for new or worsening concerns.  ° °

## 2018-06-27 LAB — CULTURE, GROUP A STREP (THRC)

## 2018-08-11 ENCOUNTER — Ambulatory Visit
Admission: EM | Admit: 2018-08-11 | Discharge: 2018-08-11 | Disposition: A | Discharge status: Home / Self Care | Payer: BLUE CROSS/BLUE SHIELD | Attending: Family Medicine | Admitting: Family Medicine

## 2018-08-11 ENCOUNTER — Other Ambulatory Visit: Payer: Self-pay

## 2018-08-11 DIAGNOSIS — Z3202 Encounter for pregnancy test, result negative: Secondary | ICD-10-CM | POA: Diagnosis not present

## 2018-08-11 DIAGNOSIS — R319 Hematuria, unspecified: Secondary | ICD-10-CM | POA: Diagnosis not present

## 2018-08-11 DIAGNOSIS — N390 Urinary tract infection, site not specified: Secondary | ICD-10-CM | POA: Diagnosis not present

## 2018-08-11 DIAGNOSIS — B9689 Other specified bacterial agents as the cause of diseases classified elsewhere: Secondary | ICD-10-CM | POA: Insufficient documentation

## 2018-08-11 DIAGNOSIS — N760 Acute vaginitis: Secondary | ICD-10-CM | POA: Insufficient documentation

## 2018-08-11 LAB — URINALYSIS, COMPLETE (UACMP) WITH MICROSCOPIC
Bilirubin Urine: NEGATIVE
Glucose, UA: NEGATIVE mg/dL
KETONES UR: NEGATIVE mg/dL
NITRITE: NEGATIVE
Protein, ur: NEGATIVE mg/dL
SPECIFIC GRAVITY, URINE: 1.02 (ref 1.005–1.030)
WBC, UA: >50 WBC/hpf (ref 0–5)
pH: 7 (ref 5.0–8.0)

## 2018-08-11 LAB — WET PREP, GENITAL
Sperm: NONE SEEN
Trich, Wet Prep: NONE SEEN
YEAST WET PREP: NONE SEEN

## 2018-08-11 LAB — HCG, QUANTITATIVE, PREGNANCY

## 2018-08-11 LAB — PREGNANCY, URINE: Preg Test, Ur: NEGATIVE

## 2018-08-11 MED ORDER — METRONIDAZOLE 500 MG PO TABS: 500.0000 mg | ORAL_TABLET | Freq: Two times a day (BID) | ORAL | 0 refills | Status: DC

## 2018-08-11 MED ORDER — CEPHALEXIN 500 MG PO CAPS: 500.0000 mg | ORAL_CAPSULE | Freq: Two times a day (BID) | ORAL | 0 refills | Status: AC

## 2018-08-11 NOTE — ED Provider Notes (Signed)
[facility] ____________________________________________  Time seen: Approximately 2:39 PM  I have reviewed the triage vital signs and the nursing notes.   HISTORY  Chief Complaint Urinary Frequency   HPI Leah Bender is a 23 y.o. female presenting for evaluation of urinary frequency, some burning with urination, lower back pain and lower pelvic pain.  Patient reports this is been present for the last few days.  Reports she has been sexually active with the same partner, however concerned that he may have been "sleeping around ".  States some whitish nonodorous vaginal discharge.  Back pain as well as pelvic discomfort is intermittent, not persistent.  Mild to moderate at most.  Has not been taking over-the-counter medications on a regular basis.  Continues to eat and drink well.  No vomiting, diarrhea, fevers, chest pain, shortness of breath or other abdominal discomfort.  No recent sickness.  Patient also requests pregnancy testing, states she is due for her Depo but has been sexually active.  Not with condoms.  No sexual activity in 1 week.    Past Medical History:  Diagnosis Date  . Depression     There are no active problems to display for this patient.   Past Surgical History:  Procedure Laterality Date  . FRACTURE SURGERY Right    ankle  . WISDOM TOOTH EXTRACTION       No current facility-administered medications for this encounter.   Current Outpatient Medications:  .  sertraline (ZOLOFT) 50 MG tablet, TAKE 1/2 TABLET DAILY FOR 1 WEEK THEN TAKE 1 TABLET DAILY THEREAFTER, Disp: , Rfl: 1 .  SUMAtriptan (IMITREX) 50 MG tablet, 1 tablet at the onset of migraine. Additional dose in 2 hours if headache persists or recurs., Disp: 10 tablet, Rfl: 0 .  cephALEXin (KEFLEX) 500 MG capsule, Take 1 capsule (500 mg total) by mouth 2 (two) times daily for 7 days., Disp: 14 capsule, Rfl: 0 .  metroNIDAZOLE (FLAGYL) 500 MG tablet, Take 1 tablet (500 mg total) by mouth 2  (two) times daily., Disp: 14 tablet, Rfl: 0  Allergies Patient has no known allergies.  Family History  Problem Relation Age of Onset  . Rheum arthritis Mother   . Lupus Mother   . Healthy Father     Social History Social History   Tobacco Use  . Smoking status: Former Smoker    Last attempt to quit: 06/25/2015    Years since quitting: 3.1  . Smokeless tobacco: Never Used  Substance Use Topics  . Alcohol use: Yes    Comment: rarely  . Drug use: No    Review of Systems Constitutional: No fever ENT: No sore throat. Cardiovascular: Denies chest pain. Respiratory: Denies shortness of breath. Gastrointestinal: No abdominal pain.  No nausea, no vomiting.  No diarrhea.  No constipation. Genitourinary: positive for dysuria. Musculoskeletal: positive for back pain. Skin: Negative for rash.   ____________________________________________   PHYSICAL EXAM:  VITAL SIGNS: ED Triage Vitals  Enc Vitals Group     BP 08/11/18 1342 118/68     Pulse Rate 08/11/18 1342 75     Resp 08/11/18 1342 18     Temp 08/11/18 1342 98.4 F (36.9 C)     Temp Source 08/11/18 1342 Oral     SpO2 08/11/18 1342 100 %     Weight 08/11/18 1340 116 lb (52.6 kg)     Height 08/11/18 1340 5\' 3"  (1.6 m)     Head Circumference --      Peak Flow --  Pain Score 08/11/18 1339 8     Pain Loc --      Pain Edu? --      Excl. in GC? --     Constitutional: Alert and oriented. Well appearing and in no acute distress. ENT      Head: Normocephalic and atraumatic. Cardiovascular: Normal rate, regular rhythm. Grossly normal heart sounds.  Good peripheral circulation. Respiratory: Normal respiratory effort without tachypnea nor retractions. Breath sounds are clear and equal bilaterally. No wheezes, rales, rhonchi. Gastrointestinal:  Normal Bowel sounds.  Minimal suprapubic and bilateral lower pelvic tenderness palpation.  Abdomen otherwise soft and nontender.  No guarding.  No CVA  tenderness. Musculoskeletal:  No midline cervical, thoracic or lumbar tenderness to palpation.  Neurologic:  Normal speech and language. Speech is normal. No gait instability.  Skin:  Skin is warm, dry and intact. No rash noted. Psychiatric: Mood and affect are normal. Speech and behavior are normal. Patient exhibits appropriate insight and judgment   ___________________________________________   LABS (all labs ordered are listed, but only abnormal results are displayed)  Labs Reviewed  WET PREP, GENITAL - Abnormal; Notable for the following components:      Result Value   Clue Cells Wet Prep HPF POC PRESENT (*)    WBC, Wet Prep HPF POC MODERATE (*)    All other components within normal limits  URINALYSIS, COMPLETE (UACMP) WITH MICROSCOPIC - Abnormal; Notable for the following components:   APPearance CLOUDY (*)    Hgb urine dipstick SMALL (*)    Leukocytes, UA MODERATE (*)    Bacteria, UA MANY (*)    All other components within normal limits  URINE CULTURE  CHLAMYDIA/NGC RT PCR ([facility] ONLY)  PREGNANCY, URINE  HCG, QUANTITATIVE, PREGNANCY   ____________________________________________  PROCEDURES Procedures     INITIAL IMPRESSION / ASSESSMENT AND PLAN / ED COURSE  Pertinent labs & imaging results that were available during my care of the patient were reviewed by me and considered in my medical decision making (see chart for details).  Well-appearing patient.  No acute distress.  Urinalysis reviewed, will culture, suspect UTI.  Patient elected for self wet prep and self gonorrhea and chlamydia swabs, declined other STD testing.  Wet prep positive for clue cells.  Will treat with oral Keflex and Flagyl.  Discussed pelvic rest, no sexual activity for at least 1 week and until symptoms resolved.  Pregnancy test negative.Discussed indication, risks and benefits of medications with patient.  Discussed follow up with Primary care physician this week. Discussed follow up and  return parameters including no resolution or any worsening concerns. Patient verbalized understanding and agreed to plan.   ____________________________________________   FINAL CLINICAL IMPRESSION(S) / ED DIAGNOSES  Final diagnoses:  Urinary tract infection with hematuria, site unspecified  Bacterial vaginosis     ED Discharge Orders         Ordered    cephALEXin (KEFLEX) 500 MG capsule  2 times daily     08/11/18 1544    metroNIDAZOLE (FLAGYL) 500 MG tablet  2 times daily     08/11/18 1544           Note: This dictation was prepared with Dragon dictation along with smaller phrase technology. Any transcriptional errors that result from this process are unintentional.         Renford Dills, NP 08/11/18 1647

## 2018-08-11 NOTE — Discharge Instructions (Signed)
Take medication as prescribed. Rest. Drink plenty of fluids.  No sexual activity for at least 1 week.  Follow up with your primary care physician this week as needed. Return to Urgent care for new or worsening concerns.
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
## 2018-08-12 LAB — CHLAMYDIA/NGC RT PCR (ARMC ONLY)
Chlamydia Tr: NOT DETECTED
N GONORRHOEAE: NOT DETECTED

## 2018-08-13 LAB — URINE CULTURE

## 2018-10-11 ENCOUNTER — Ambulatory Visit
Admission: EM | Admit: 2018-10-11 | Discharge: 2018-10-11 | Disposition: A | Discharge status: Home / Self Care | Payer: BLUE CROSS/BLUE SHIELD | Attending: Emergency Medicine | Admitting: Emergency Medicine

## 2018-10-11 ENCOUNTER — Encounter: Payer: Self-pay | Admitting: Emergency Medicine

## 2018-10-11 ENCOUNTER — Other Ambulatory Visit: Payer: Self-pay

## 2018-10-11 DIAGNOSIS — Z3202 Encounter for pregnancy test, result negative: Secondary | ICD-10-CM | POA: Diagnosis not present

## 2018-10-11 DIAGNOSIS — N926 Irregular menstruation, unspecified: Secondary | ICD-10-CM

## 2018-10-11 DIAGNOSIS — R110 Nausea: Secondary | ICD-10-CM | POA: Diagnosis not present

## 2018-10-11 LAB — URINALYSIS, COMPLETE (UACMP) WITH MICROSCOPIC
Bacteria, UA: NONE SEEN
Bilirubin Urine: NEGATIVE
Glucose, UA: NEGATIVE mg/dL
Ketones, ur: NEGATIVE mg/dL
Leukocytes,Ua: NEGATIVE
NITRITE: NEGATIVE
Protein, ur: 30 mg/dL — AB
SPECIFIC GRAVITY, URINE: 1.015 (ref 1.005–1.030)
pH: 8.5 — ABNORMAL HIGH (ref 5.0–8.0)

## 2018-10-11 LAB — PREGNANCY, URINE: Preg Test, Ur: NEGATIVE

## 2018-10-11 NOTE — Discharge Instructions (Addendum)
Continue to monitor menstrual bleeding/spotting. If continue to have fatigue, nausea and bloating after the spotting and bleeding has resolved, return for recheck. Otherwise follow-up prn.

## 2018-10-11 NOTE — ED Provider Notes (Signed)
[facility]    CSN: [id number] Arrival date & time: 10/11/18  1341     History   Chief Complaint Chief Complaint  Patient presents with  . Possible Pregnancy    HPI Leah Bender is a 23 y.o. female.   24 year old female presents with fatigue and feeling bloated for the past 2 weeks. Also having nausea in the morning with one episode of vomiting and occasional diarrhea. Denies any fever, sore throat, lower abdominal pain, pain with urination or unusual vaginal discharge. Was on DepoProvera- last injection in Oct 2019 and had a short period in January 2020. Has not had a period since and had unprotected intercourse about 1 month ago. Just started to spot yesterday and today when obtaining a urine sample. Has been under increased stress and worried over the health of her significant other. Other health issues include depression and migraine headaches and takes Zoloft daily and Imitrex prn.   The history is provided by the patient.    Past Medical History:  Diagnosis Date  . Depression     There are no active problems to display for this patient.   Past Surgical History:  Procedure Laterality Date  . FRACTURE SURGERY Right    ankle  . WISDOM TOOTH EXTRACTION      OB History   No obstetric history on file.      Home Medications    Prior to Admission medications   Medication Sig Start Date End Date Taking? Authorizing Provider  sertraline (ZOLOFT) 50 MG tablet TAKE 1/2 TABLET DAILY FOR 1 WEEK THEN TAKE 1 TABLET DAILY THEREAFTER 01/09/18  Yes [provider]  SUMAtriptan (IMITREX) 50 MG tablet 1 tablet at the onset of migraine. Additional dose in 2 hours if headache persists or recurs. 02/04/18  Yes Tommie Sams, DO    Family History Family History  Problem Relation Age of Onset  . Rheum arthritis Mother   . Lupus Mother   . Healthy Father     Social History Social History   Tobacco Use  . Smoking status: Former Smoker    Last attempt to  quit: 06/25/2015    Years since quitting: 3.2  . Smokeless tobacco: Never Used  Substance Use Topics  . Alcohol use: Yes    Comment: rarely  . Drug use: No     Allergies   Patient has no known allergies.   Review of Systems Review of Systems  Constitutional: Positive for appetite change and fatigue. Negative for activity change, chills and fever.  HENT: Negative for congestion, mouth sores and sore throat.   Respiratory: Negative for cough, chest tightness and shortness of breath.   Cardiovascular: Negative for chest pain and palpitations.  Gastrointestinal: Positive for diarrhea (occasional), nausea and vomiting (once). Negative for abdominal pain.  Genitourinary: Positive for menstrual problem and vaginal bleeding (spotting). Negative for decreased urine volume, difficulty urinating, dysuria, flank pain, frequency, genital sores, hematuria, pelvic pain, vaginal discharge and vaginal pain.  Musculoskeletal: Negative for arthralgias, back pain and myalgias.  Skin: Negative for color change, rash and wound.  Neurological: Positive for headaches. Negative for dizziness, tremors, seizures, syncope, weakness, light-headedness and numbness.  Hematological: Negative for adenopathy. Does not bruise/bleed easily.     Physical Exam Triage Vital Signs ED Triage Vitals  Enc Vitals Group     BP 10/11/18 1413 115/78     Pulse Rate 10/11/18 1413 97     Resp 10/11/18 1413 14  Temp 10/11/18 1413 98.4 F (36.9 C)     Temp Source 10/11/18 1413 Oral     SpO2 10/11/18 1413 98 %     Weight 10/11/18 1410 120 lb (54.4 kg)     Height 10/11/18 1410 5\' 2"  (1.575 m)     Head Circumference --      Peak Flow --      Pain Score 10/11/18 1410 0     Pain Loc --      Pain Edu? --      Excl. in GC? --    No data found.  Updated Vital Signs BP 115/78 (BP Location: Left Arm)   Pulse 97   Temp 98.4 F (36.9 C) (Oral)   Resp 14   Ht 5\' 2"  (1.575 m)   Wt 120 lb (54.4 kg)   LMP 07/12/2018  (Approximate)   SpO2 98%   BMI 21.95 kg/m   Visual Acuity Right Eye Distance:   Left Eye Distance:   Bilateral Distance:    Right Eye Near:   Left Eye Near:    Bilateral Near:     Physical Exam Vitals signs and nursing note reviewed.  Constitutional:      General: She is awake. She is not in acute distress.    Appearance: She is well-developed and well-groomed.     Comments: Patient sitting comfortably on exam table in no acute distress but teary-eyed and anxious.   HENT:     Head: Normocephalic and atraumatic.     Mouth/Throat:     Lips: Pink.     Mouth: Mucous membranes are moist.     Pharynx: Oropharynx is clear. Uvula midline.  Eyes:     Conjunctiva/sclera:     Right eye: Right conjunctiva is injected.     Left eye: Left conjunctiva is injected.     Comments: Due to recent crying  Neck:     Musculoskeletal: Normal range of motion.  Cardiovascular:     Rate and Rhythm: Normal rate and regular rhythm.     Heart sounds: Normal heart sounds. No murmur.  Pulmonary:     Effort: Pulmonary effort is normal. No respiratory distress.     Breath sounds: Normal breath sounds and air entry. No decreased air movement. No decreased breath sounds, wheezing, rhonchi or rales.  Abdominal:     General: Bowel sounds are normal. There is distension (slight).     Palpations: Abdomen is soft.     Tenderness: There is abdominal tenderness (slight lower abdominal bilateral ). There is no right CVA tenderness, left CVA tenderness, guarding or rebound.  Musculoskeletal: Normal range of motion.  Skin:    General: Skin is warm and dry.     Findings: No rash.  Neurological:     General: No focal deficit present.     Mental Status: She is alert and oriented to person, place, and time.  Psychiatric:        Attention and Perception: Attention normal.        Mood and Affect: Mood is anxious. Affect is tearful.        Speech: Speech normal.        Behavior: Behavior is cooperative.         Thought Content: Thought content normal.        Cognition and Memory: Cognition normal.        Judgment: Judgment normal.      UC Treatments / Results  Labs (all labs ordered are listed, but  only abnormal results are displayed) Labs Reviewed  URINALYSIS, COMPLETE (UACMP) WITH MICROSCOPIC - Abnormal; Notable for the following components:      Result Value   Color, Urine RED (*)    APPearance HAZY (*)    pH 8.5 (*)    Hgb urine dipstick LARGE (*)    Protein, ur 30 (*)    All other components within normal limits  PREGNANCY, URINE    EKG None  Radiology No results found.  Procedures Procedures (including critical care time)  Medications Ordered in UC Medications - No data to display  Initial Impression / Assessment and Plan / UC Course  I have reviewed the triage vital signs and the nursing notes.  Pertinent labs & imaging results that were available during my care of the patient were reviewed by me and considered in my medical decision making (see chart for details).    Reviewed negative urine pregnancy result with patient. Discussed fairly high accuracy of test. Reviewed urinalysis results with patient- no UTI- blood present due to vaginal bleeding- current symptoms may have been due to premenstrual syndrome. Most likely having a regular period now but continue to monitor bleeding pattern. Patient not concerned about STD today. Recommend monitor symptoms and if they continue after her menstrual period is over, may return for recheck. Otherwise, encouraged to use condoms for any future sexual encounter. Follow-up as needed.  Final Clinical Impressions(s) / UC Diagnoses   Final diagnoses:  Irregular periods  Nausea     Discharge Instructions     Continue to monitor menstrual bleeding/spotting. If continue to have fatigue, nausea and bloating after the spotting and bleeding has resolved, return for recheck. Otherwise follow-up prn.     ED Prescriptions    None      Controlled Substance Prescriptions [place] Controlled Substance Registry consulted? Not Applicable   Sudie Grumbling, NP 10/11/18 2225

## 2018-10-11 NOTE — ED Triage Notes (Signed)
Patient here for pregnancy test.  Patient states that she feels bloated and feels fatigued.  Patient reports nausea in the morning.

## 2019-04-21 ENCOUNTER — Other Ambulatory Visit: Payer: Self-pay

## 2019-04-21 ENCOUNTER — Ambulatory Visit
Admission: EM | Admit: 2019-04-21 | Discharge: 2019-04-21 | Disposition: A | Discharge status: Home / Self Care | Payer: BLUE CROSS/BLUE SHIELD | Attending: Urgent Care | Admitting: Urgent Care

## 2019-04-21 DIAGNOSIS — B9689 Other specified bacterial agents as the cause of diseases classified elsewhere: Secondary | ICD-10-CM

## 2019-04-21 DIAGNOSIS — N390 Urinary tract infection, site not specified: Secondary | ICD-10-CM | POA: Diagnosis not present

## 2019-04-21 DIAGNOSIS — N760 Acute vaginitis: Secondary | ICD-10-CM

## 2019-04-21 DIAGNOSIS — Z3202 Encounter for pregnancy test, result negative: Secondary | ICD-10-CM

## 2019-04-21 DIAGNOSIS — Z202 Contact with and (suspected) exposure to infections with a predominantly sexual mode of transmission: Secondary | ICD-10-CM

## 2019-04-21 LAB — URINALYSIS, COMPLETE (UACMP) WITH MICROSCOPIC
Bilirubin Urine: NEGATIVE
Glucose, UA: NEGATIVE mg/dL
Ketones, ur: NEGATIVE mg/dL
Nitrite: NEGATIVE
Protein, ur: NEGATIVE mg/dL
Specific Gravity, Urine: >1.03 — ABNORMAL HIGH (ref 1.005–1.030)
pH: 7 (ref 5.0–8.0)

## 2019-04-21 LAB — WET PREP, GENITAL
Sperm: NONE SEEN
Trich, Wet Prep: NONE SEEN
Yeast Wet Prep HPF POC: NONE SEEN

## 2019-04-21 LAB — PREGNANCY, URINE: Preg Test, Ur: NEGATIVE

## 2019-04-21 MED ORDER — PHENAZOPYRIDINE HCL 200 MG PO TABS: 200.0000 mg | ORAL_TABLET | Freq: Three times a day (TID) | ORAL | 0 refills | Status: DC | PRN

## 2019-04-21 MED ORDER — METRONIDAZOLE 500 MG PO TABS: 500.0000 mg | ORAL_TABLET | Freq: Two times a day (BID) | ORAL | 0 refills | Status: DC

## 2019-04-21 MED ORDER — FLUCONAZOLE 150 MG PO TABS: ORAL_TABLET | ORAL | 0 refills | Status: DC

## 2019-04-21 MED ORDER — SULFAMETHOXAZOLE-TRIMETHOPRIM 800-160 MG PO TABS: 1.0000 | ORAL_TABLET | Freq: Two times a day (BID) | ORAL | 0 refills | Status: AC

## 2019-04-21 NOTE — ED Provider Notes (Signed)
[place],    Name: Leah Bender DOB: [birth year] MRN: [id number] CSN: [id number] PCP: System, Pcp Not In  Arrival date and time:  04/21/19 1159  Chief Complaint:  Vaginal Discharge   NOTE: Prior to seeing the patient today, I have reviewed the triage nursing documentation and vital signs. Clinical staff has updated patient's PMH/PSHx, current medication list, and drug allergies/intolerances to ensure comprehensive history available to assist in medical decision making.   History:   HPI: Leah Bender is a 23 y.o. female who presents today with complaints of urinary symptoms that began with acute onset 7 days ago. She complains of dysuria, frequency, and urgency. She has not appreciated any gross hematuria. Her urine has been malodorous. Patient denies any associated nausea, vomiting, fever, and chills. She has not experienced any pain in her lower back, flank area, or abdomen. Patient advises that she has a past medical history that is significant for recurrent urinary tract infections. Patient endorses that she engages in unprotected sexual activity. She notes that sexual activity is is not in the context of a committed monogamous relationship. Patient states, "I just slept with this guy and forgot to pee after we had sex". She denies any vaginal pain or bleeding, but has appreciated some significant vaginal discharge. Patient's last menstrual period was 04/07/2019. There are no concerns that she is currently pregnant.   Past Medical History:  Diagnosis Date  . Depression     Past Surgical History:  Procedure Laterality Date  . FRACTURE SURGERY Right    ankle  . WISDOM TOOTH EXTRACTION      Family History  Problem Relation Age of Onset  . Rheum arthritis Mother   . Lupus Mother   . Healthy Father     Social History   Tobacco Use  . Smoking status: Former Smoker    Quit date: 06/25/2015    Years since quitting: 3.8  . Smokeless tobacco: Never Used  Substance Use Topics   . Alcohol use: Yes  . Drug use: No    There are no active problems to display for this patient.   Home Medications:    Current Meds  Medication Sig  . SUMAtriptan (IMITREX) 50 MG tablet 1 tablet at the onset of migraine. Additional dose in 2 hours if headache persists or recurs.    Allergies:   Patient has no known allergies.  Review of Systems (ROS): Review of Systems  Constitutional: Negative for chills and fever.  Respiratory: Negative for cough and shortness of breath.   Cardiovascular: Negative for chest pain and palpitations.  Gastrointestinal: Negative for abdominal pain, nausea and vomiting.  Genitourinary: Positive for dysuria, frequency, urgency and vaginal discharge. Negative for decreased urine volume, hematuria, pelvic pain, vaginal bleeding and vaginal pain.       Urine and vaginal discharge are malodorous.   Musculoskeletal: Negative for back pain.  Skin: Negative for color change, pallor and rash.  Neurological: Negative for dizziness, syncope, weakness and headaches.  All other systems reviewed and are negative.    Vital Signs: Today's Vitals   04/21/19 1226 04/21/19 1228 04/21/19 1327  BP:  101/82   Pulse:  78   Resp:  15   Temp:  98.2 F (36.8 C)   TempSrc:  Oral   SpO2:  100%   Weight: 123 lb (55.8 kg)    Height: 5\' 4"  (1.626 m)    PainSc: 6   6     Physical Exam: Physical Exam  Constitutional: She  is oriented to person, place, and time and well-developed, well-nourished, and in no distress. No distress.  HENT:  Head: Normocephalic and atraumatic.  Nose: Nose normal.  Mouth/Throat: Oropharynx is clear and moist.  Eyes: Pupils are equal, round, and reactive to light.  Neck: Normal range of motion. Neck supple.  Cardiovascular: Normal rate, regular rhythm, normal heart sounds and intact distal pulses. Exam reveals no gallop and no friction rub.  No murmur heard. Pulmonary/Chest: Effort normal and breath sounds normal. No respiratory  distress. She has no wheezes. She has no rales.  Abdominal: Soft. Bowel sounds are normal. She exhibits no distension. There is no abdominal tenderness. There is no CVA tenderness.  Genitourinary:    Genitourinary Comments: Exam deferred. No vaginal/pelvic pain or bleeding. Patient is not currently pregnant. She has elected to self collect specimen swab for wet prep and DNA probe for GC.   Musculoskeletal: Normal range of motion.  Neurological: She is alert and oriented to person, place, and time. Gait normal.  Skin: Skin is warm and dry. No rash noted. She is not diaphoretic.  Psychiatric: Mood, memory, affect and judgment normal.  Nursing note and vitals reviewed.   Urgent Care Treatments / Results:   LABS: PLEASE NOTE: all labs that were ordered this encounter are listed, however only abnormal results are displayed. Labs Reviewed  WET PREP, GENITAL - Abnormal; Notable for the following components:      Result Value   Clue Cells Wet Prep HPF POC PRESENT (*)    WBC, Wet Prep HPF POC MODERATE (*)    All other components within normal limits  URINALYSIS, COMPLETE (UACMP) WITH MICROSCOPIC - Abnormal; Notable for the following components:   APPearance CLOUDY (*)    Specific Gravity, Urine >1.030 (*)    Hgb urine dipstick TRACE (*)    Leukocytes,Ua TRACE (*)    Bacteria, UA MANY (*)    All other components within normal limits  GC/CHLAMYDIA PROBE AMP  PREGNANCY, URINE    EKG: -None  RADIOLOGY: No results found.  PROCEDURES: Procedures  MEDICATIONS RECEIVED THIS VISIT: Medications - No data to display  PERTINENT CLINICAL COURSE NOTES/UPDATES:   Initial Impression / Assessment and Plan / Urgent Care Course:  Pertinent labs & imaging results that were available during my care of the patient were personally reviewed by me and considered in my medical decision making (see lab/imaging section of note for values and interpretations).  Leah Bender is a 23 y.o. female who  presents to [facility] today with complaints of vaginal discharge and urinary symptoms.    Patient is well appearing overall in clinic today. She does not appear to be in any acute distress. Presenting symptoms (see HPI) and exam as documented above.  Urine HCG (-) for pregnancy.  Wet prep (+) for clue cells, which is consistent with bacterial vaginosis (BV) infection.  Will treat with a 7 day course of oral metronidazole. Patient encouraged to complete the entire course of antibiotics even if she begins to feel better. Educated on need to avoid all ETOH while sheis taking this medication in order to prevent a disulfiram like reaction that will result in significant nausea and vomiting. Patient encouraged to increase her fluid intake as much as possible. Discussed that water is always best to flush the urinary tract and prevent development of a urinary tract infection while on treatment for the BV.  UA was (+) for infection; reflex culture sent.   Will treat with a 3  day course of SMZ-TMP DS. Patient encouraged to complete the entire course of antibiotics even if she begins to feel better. She was advised that if culture demonstrates resistance to the prescribed antibiotic, she will be contacted and advised of the need to change the antibiotic being used to treat her infection. Patient encouraged to increase her fluid intake as much as possible. Discussed that water is always best to flush the urinary tract. She was advised to avoid caffeine containing fluids until her infections clears, as caffeine can cause her to experience painful bladder spasms. May use Tylenol and/or Ibuprofen as needed for pain/fever. Will also send in a prescription for phenazopyridine to help relieve her current urinary pain.   Patient prone to developing vulvovaginal candidiasis with antibiotics.   Will send in a prescription for prophylactic fluconazole for PRN use.  Discussed follow up with primary care physician  in 1 week for re-evaluation. I have reviewed the follow up and strict return precautions for any new or worsening symptoms. Patient is aware of symptoms that would be deemed urgent/emergent, and would thus require further evaluation either here or in the emergency department. At the time of discharge, she verbalized understanding and consent with the discharge plan as it was reviewed with her. All questions were fielded by provider and/or clinic staff prior to patient discharge.    Final Clinical Impressions / Urgent Care Diagnoses:   Final diagnoses:  BV (bacterial vaginosis)  Urinary tract infection without hematuria, site unspecified  Possible exposure to STD    New Prescriptions:  [place] Controlled Substance Registry consulted? Not Applicable  Meds ordered this encounter  Medications  . fluconazole (DIFLUCAN) 150 MG tablet    Sig: Take 1 tablet (150 mg) PO x 1 dose. May repeat 150 mg dose in 3 days if still symptomatic.    Dispense:  2 tablet    Refill:  0  . sulfamethoxazole-trimethoprim (BACTRIM DS) 800-160 MG tablet    Sig: Take 1 tablet by mouth 2 (two) times daily for 3 days.    Dispense:  6 tablet    Refill:  0  . metroNIDAZOLE (FLAGYL) 500 MG tablet    Sig: Take 1 tablet (500 mg total) by mouth 2 (two) times daily.    Dispense:  14 tablet    Refill:  0  . phenazopyridine (PYRIDIUM) 200 MG tablet    Sig: Take 1 tablet (200 mg total) by mouth 3 (three) times daily as needed for pain.    Dispense:  9 tablet    Refill:  0    Recommended Follow up Care:  Patient encouraged to follow up with the following provider within the specified time frame, or sooner as dictated by the severity of her symptoms. As always, she was instructed that for any urgent/emergent care needs, she should seek care either here or in the emergency department for more immediate evaluation.  Follow-up Information    PCP In 1 week.   Why: General reassessment of symptoms if not improving        NOTE:  This note was prepared using Scientist, clinical (histocompatibility and immunogenetics)Dragon dictation software along with smaller Lobbyistphrase technology. Despite my best ability to proofread, there is the potential that transcriptional errors may still occur from this process, and are completely unintentional.    Verlee MonteGray, Elius Etheredge E, NP 04/21/19 2149

## 2019-04-21 NOTE — Discharge Instructions (Addendum)
It was very nice seeing you today in clinic. Thank you for entrusting me with your care.   As discussed, your urine and vaginal swabs POSITIVE for infection. Will approach treatment as follows:  Prescription has been sent to your pharmacy for antibiotics.  Please pick up and take as directed. FINISH the entire course of medication even if you are feeling better.  A culture will be sent on your provided sample. If it comes back resistant to what I have prescribed you, someone will call you and let you know that we will need to change antibiotics. Increase fluid intake as much as possible to flush your urinary tract.  Water is always the best.  Avoid caffeine until your infection clears up, as it can contribute to painful bladder spasms.  May use Tylenol and/or Ibuprofen as needed for pain/fever. Will send in some pyridium to help with pain/discomfort.   Make arrangements to follow up with your regular doctor in 1 week for re-evaluation. If your symptoms/condition worsens, please seek follow up care either here or in the ER. Please remember, our [place] providers are "right here with you" when you need [place].   Again, it was my pleasure to take care of you today. Thank you for choosing our clinic. I hope that you start to feel better quickly.   Honor Loh, MSN, APRN, FNP-C, CEN Advanced Practice Provider [facility]

## 2019-04-21 NOTE — ED Triage Notes (Signed)
Patient complains of vaginal discharge and odor with itching x 6 days.

## 2019-04-22 LAB — GC/CHLAMYDIA PROBE AMP
Chlamydia trachomatis, NAA: NEGATIVE
Neisseria Gonorrhoeae by PCR: NEGATIVE

## 2019-11-02 ENCOUNTER — Ambulatory Visit
Admission: EM | Admit: 2019-11-02 | Discharge: 2019-11-02 | Disposition: A | Discharge status: Home / Self Care | Payer: BLUE CROSS/BLUE SHIELD | Attending: Emergency Medicine | Admitting: Emergency Medicine

## 2019-11-02 ENCOUNTER — Other Ambulatory Visit: Payer: Self-pay

## 2019-11-02 ENCOUNTER — Ambulatory Visit (INDEPENDENT_AMBULATORY_CARE_PROVIDER_SITE_OTHER): Payer: BLUE CROSS/BLUE SHIELD

## 2019-11-02 DIAGNOSIS — Z20822 Contact with and (suspected) exposure to covid-19: Secondary | ICD-10-CM | POA: Insufficient documentation

## 2019-11-02 DIAGNOSIS — R05 Cough: Secondary | ICD-10-CM

## 2019-11-02 DIAGNOSIS — J069 Acute upper respiratory infection, unspecified: Secondary | ICD-10-CM | POA: Insufficient documentation

## 2019-11-02 DIAGNOSIS — R062 Wheezing: Secondary | ICD-10-CM

## 2019-11-02 MED ORDER — IBUPROFEN 600 MG PO TABS: 600.0000 mg | ORAL_TABLET | Freq: Four times a day (QID) | ORAL | 0 refills | Status: DC | PRN

## 2019-11-02 MED ORDER — FLUTICASONE PROPIONATE 50 MCG/ACT NA SUSP: 2.0000 | Freq: Every day | NASAL | 0 refills | Status: DC

## 2019-11-02 MED ORDER — BENZONATATE 200 MG PO CAPS: 200.0000 mg | ORAL_CAPSULE | Freq: Three times a day (TID) | ORAL | 0 refills | Status: DC | PRN

## 2019-11-02 NOTE — ED Triage Notes (Signed)
Pt states starting Saturday with nasal congestion, cough and sore throat yesterday but better today. Doesn't think she has a fever.

## 2019-11-02 NOTE — ED Provider Notes (Signed)
HPI  SUBJECTIVE:  Leah Bender is a 24 y.o. female who presents with 2 days of greenish-yellow nasal congestion, cough, fatigue.  States that she has felt "hot" but no documented fevers.  She reports body aches, headaches, sore throat, postnasal drip, loss of taste.  She reports wheezing, shortness of breath.  She tried Zyrtec, NyQuil, Advil 800 mg.  The NyQuil helps.  Symptoms are worse with exertion.  No sinus pain or pressure, loss of smell, chest pain, nausea, vomiting, diarrhea, abdominal pain.  No known Covid exposure.  She has not yet gotten Covid vaccine.  She got a flu shot this year.  No antibiotics in the past month.  She took an antipyretic within 4 to 6 hours of evaluation.  Past medical history of frequent sinusitis.  No history of pulmonary disease, smoking, diabetes, hypertension, allergies, coronary artery disease, chronic kidney disease, cancer, HIV, immunocompromise.  LMP: Last week.  Denies the possibility of being pregnant.  PMD: At [facility].    Past Medical History:  Diagnosis Date  . Depression     Past Surgical History:  Procedure Laterality Date  . FRACTURE SURGERY Right    ankle  . WISDOM TOOTH EXTRACTION      Family History  Problem Relation Age of Onset  . Rheum arthritis Mother   . Lupus Mother   . Healthy Father     Social History   Tobacco Use  . Smoking status: Former Smoker    Quit date: 06/25/2015    Years since quitting: 4.3  . Smokeless tobacco: Never Used  Substance Use Topics  . Alcohol use: Yes    Comment: social  . Drug use: No    No current facility-administered medications for this encounter.  Current Outpatient Medications:  .  benzonatate (TESSALON) 200 MG capsule, Take 1 capsule (200 mg total) by mouth 3 (three) times daily as needed for cough., Disp: 30 capsule, Rfl: 0 .  fluticasone (FLONASE) 50 MCG/ACT nasal spray, Place 2 sprays into both nostrils daily., Disp: 16 g, Rfl: 0 .  ibuprofen (ADVIL) 600 MG tablet, Take 1  tablet (600 mg total) by mouth every 6 (six) hours as needed., Disp: 30 tablet, Rfl: 0 .  SUMAtriptan (IMITREX) 50 MG tablet, 1 tablet at the onset of migraine. Additional dose in 2 hours if headache persists or recurs., Disp: 10 tablet, Rfl: 0  No Known Allergies   ROS  As noted in HPI.   Physical Exam  BP 121/79 (BP Location: Right Arm)   Temp 98.4 F (36.9 C) (Oral)   Resp 16   Ht 5\' 3"  (1.6 m)   Wt 56.7 kg   LMP 10/26/2019   SpO2 99%   BMI 22.14 kg/m   Constitutional: Well developed, well nourished, no acute distress Eyes:  EOMI, conjunctiva normal bilaterally HENT: Normocephalic, atraumatic,mucus membranes moist.  Positive clear nasal congestion.  Erythematous, swollen turbinates.  No maxillary, frontal sinus tenderness.  Slightly erythematous oropharynx.  Tonsils normal without exudates.  Uvula midline.  Positive postnasal drip/cobblestoning. Neck: No cervical lymphadenopathy Respiratory: Normal inspiratory effort. wheezing right side Cardiovascular: Normal rate no murmurs rubs or gallops GI: nondistended skin: No rash, skin intact Musculoskeletal: no deformities Neurologic: Alert & oriented x 3, no focal neuro deficits Psychiatric: Speech and behavior appropriate   ED Course   Medications - No data to display  Orders Placed This Encounter  Procedures  . Novel Coronavirus, NAA (Hosp order, Send-out to Ref Lab; TAT 18-24 hrs  Standing Status:   Standing    Number of Occurrences:   1    Order Specific Question:   Is this test for diagnosis or screening    Answer:   Screening    Order Specific Question:   Symptomatic for COVID-19 as defined by CDC    Answer:   No    Order Specific Question:   Hospitalized for COVID-19    Answer:   No    Order Specific Question:   Admitted to ICU for COVID-19    Answer:   No    Order Specific Question:   Previously tested for COVID-19    Answer:   No    Order Specific Question:   Resident in a congregate (group) care  setting    Answer:   No    Order Specific Question:   Employed in healthcare setting    Answer:   No    Order Specific Question:   Pregnant    Answer:   No  . DG Chest 2 View    Standing Status:   Standing    Number of Occurrences:   1    Order Specific Question:   Reason for Exam (SYMPTOM  OR DIAGNOSIS REQUIRED)    Answer:   cough wheex R side r/o PNA    No results found for this or any previous visit (from the past 24 hour(s)). DG Chest 2 View  Result Date: 11/02/2019 CLINICAL DATA:  Cough. Wheezing on the right. Ex-smoker. EXAM: CHEST - 2 VIEW COMPARISON:  10/24/2009 FINDINGS: Normal sized heart. Clear lungs with normal vascularity. Stable mild diffuse peribronchial thickening. Unremarkable bones. IMPRESSION: Stable mild chronic bronchitic changes. Electronically Signed   By: Beckie Salts M.D.   On: 11/02/2019 09:56    ED Clinical Impression  1. Viral URI with cough   2. Encounter for laboratory testing for COVID-19 virus      ED Assessment/Plan  Checking chest x-ray due to the wheezing on the right side to rule out pneumonia.  Covid PCR sent.  Reviewed imaging independently.  Stable mild chronic bronchitic changes.  See radiology report for full details.  x-ray negative for pneumonia.  Patient with a URI, suspect Covid.  Home with Mucinex D, Flonase, saline nasal irrigation with a Lloyd Huger med sinus rinse and distilled water, Tessalon, ibuprofen 600 mg combined with 1000 mg of Tylenol 3-4 times a day.  Work note for 2 days.  Follow up with PMD as needed, to the ER if she gets worse.  Discussed labs, imaging, MDM, treatment plan, and plan for follow-up with patient. Discussed sn/sx that should prompt return to the ED. patient agrees with plan.   Meds ordered this encounter  Medications  . fluticasone (FLONASE) 50 MCG/ACT nasal spray    Sig: Place 2 sprays into both nostrils daily.    Dispense:  16 g    Refill:  0  . benzonatate (TESSALON) 200 MG capsule    Sig: Take 1 capsule  (200 mg total) by mouth 3 (three) times daily as needed for cough.    Dispense:  30 capsule    Refill:  0  . ibuprofen (ADVIL) 600 MG tablet    Sig: Take 1 tablet (600 mg total) by mouth every 6 (six) hours as needed.    Dispense:  30 tablet    Refill:  0    *This clinic note was created using Scientist, clinical (histocompatibility and immunogenetics). Therefore, there may be occasional mistakes despite careful proofreading.   ?  Melynda Ripple, MD 11/03/19 [phone]

## 2019-11-02 NOTE — Discharge Instructions (Addendum)
Your chest x-ray was negative for pneumonia.  You might want to buy a pulse oximeter to keep an eye on your oxygen levels.  Go to the ER if is below 90.  Mucinex D, Flonase, saline nasal irrigation with a Lloyd Huger med sinus rinse and distilled water as often as you want to prevent sinus infection and to lessen the amount of postnasal drip, Tessalon, ibuprofen 600 mg combined with 1000 mg of Tylenol 3-4 times a day.  We will contact you if your Covid test comes back positive.

## 2019-11-03 LAB — NOVEL CORONAVIRUS, NAA (HOSP ORDER, SEND-OUT TO REF LAB; TAT 18-24 HRS): SARS-CoV-2, NAA: NOT DETECTED

## 2020-01-25 ENCOUNTER — Ambulatory Visit
Admission: EM | Admit: 2020-01-25 | Discharge: 2020-01-25 | Disposition: A | Discharge status: Home / Self Care | Payer: BLUE CROSS/BLUE SHIELD | Attending: Family Medicine | Admitting: Family Medicine

## 2020-01-25 ENCOUNTER — Other Ambulatory Visit: Payer: Self-pay

## 2020-01-25 ENCOUNTER — Encounter: Payer: Self-pay | Admitting: Emergency Medicine

## 2020-01-25 DIAGNOSIS — Z113 Encounter for screening for infections with a predominantly sexual mode of transmission: Secondary | ICD-10-CM | POA: Diagnosis present

## 2020-01-25 DIAGNOSIS — N760 Acute vaginitis: Secondary | ICD-10-CM | POA: Insufficient documentation

## 2020-01-25 DIAGNOSIS — B9689 Other specified bacterial agents as the cause of diseases classified elsewhere: Secondary | ICD-10-CM | POA: Diagnosis present

## 2020-01-25 LAB — WET PREP, GENITAL
Sperm: NONE SEEN
Trich, Wet Prep: NONE SEEN
Yeast Wet Prep HPF POC: NONE SEEN

## 2020-01-25 LAB — URINALYSIS, COMPLETE (UACMP) WITH MICROSCOPIC
Bilirubin Urine: NEGATIVE
Glucose, UA: NEGATIVE mg/dL
Ketones, ur: NEGATIVE mg/dL
Leukocytes,Ua: NEGATIVE
Nitrite: NEGATIVE
Protein, ur: NEGATIVE mg/dL
RBC / HPF: NONE SEEN RBC/hpf (ref 0–5)
Specific Gravity, Urine: 1.02 (ref 1.005–1.030)
pH: 6 (ref 5.0–8.0)

## 2020-01-25 MED ORDER — METRONIDAZOLE 500 MG PO TABS: 500.0000 mg | ORAL_TABLET | Freq: Two times a day (BID) | ORAL | 0 refills | Status: DC

## 2020-01-25 NOTE — ED Provider Notes (Signed)
[facility] ____________________________________________  Time seen: Approximately 2:54 PM  I have reviewed the triage vital signs and the nursing notes.   HISTORY  Chief Complaint Vaginal Discharge, vaginal odor, and Dysuria   HPI Leah Bender is a 24 y.o. female presenting for evaluation of vaginal discharge and vaginal odor present for the last 3 weeks.  States was seen by her primary care for similar and was treated with yeast, but reports no resolution.  Patient states not highly concerned for STDs, but would like to have gonorrhea and Chlamydia testing completed.  Some burning with urination, but denies urinary frequency or urgency.  Denies abdominal pain, back pain, vomiting, diarrhea.  Denies aggravating or alleviating factors otherwise.  Reports otherwise doing well.  Patient's last menstrual period was 01/11/2020 (approximate).  Denies pregnancy.   Past Medical History:  Diagnosis Date   Depression     There are no problems to display for this patient.   Past Surgical History:  Procedure Laterality Date   FRACTURE SURGERY Right    ankle   WISDOM TOOTH EXTRACTION       No current facility-administered medications for this encounter.  Current Outpatient Medications:    SUMAtriptan (IMITREX) 50 MG tablet, 1 tablet at the onset of migraine. Additional dose in 2 hours if headache persists or recurs., Disp: 10 tablet, Rfl: 0   benzonatate (TESSALON) 200 MG capsule, Take 1 capsule (200 mg total) by mouth 3 (three) times daily as needed for cough., Disp: 30 capsule, Rfl: 0   fluticasone (FLONASE) 50 MCG/ACT nasal spray, Place 2 sprays into both nostrils daily., Disp: 16 g, Rfl: 0   ibuprofen (ADVIL) 600 MG tablet, Take 1 tablet (600 mg total) by mouth every 6 (six) hours as needed., Disp: 30 tablet, Rfl: 0   metroNIDAZOLE (FLAGYL) 500 MG tablet, Take 1 tablet (500 mg total) by mouth 2 (two) times daily., Disp: 14 tablet, Rfl: 0  Allergies Patient  has no known allergies.  Family History  Problem Relation Age of Onset   Rheum arthritis Mother    Lupus Mother    Healthy Father     Social History Social History   Tobacco Use   Smoking status: Former Smoker    Quit date: 06/25/2015    Years since quitting: 4.5   Smokeless tobacco: Never Used  Vaping Use   Vaping Use: Every day   Substances: CBD   Devices: Juul  Substance Use Topics   Alcohol use: Yes    Comment: social   Drug use: No    Review of Systems Constitutional: No fever Cardiovascular: Denies chest pain. Respiratory: Denies shortness of breath. Gastrointestinal: No abdominal pain.  Genitourinary: As above. Musculoskeletal: Negative for back pain. Skin: Negative for rash.   ____________________________________________   PHYSICAL EXAM:  VITAL SIGNS: ED Triage Vitals  Enc Vitals Group     BP 01/25/20 1301 112/66     Pulse Rate 01/25/20 1301 87     Resp 01/25/20 1301 18     Temp 01/25/20 1301 98.4 F (36.9 C)     Temp Source 01/25/20 1301 Oral     SpO2 01/25/20 1301 100 %     Weight 01/25/20 1306 125 lb (56.7 kg)     Height 01/25/20 1306 5\' 2"  (1.575 m)     Head Circumference --      Peak Flow --      Pain Score 01/25/20 1305 5     Pain Loc --  Pain Edu? --      Excl. in GC? --     Constitutional: Alert and oriented. Well appearing and in no acute distress. Eyes: Conjunctivae are normal.  ENT      Head: Normocephalic and atraumatic. Cardiovascular:   Good peripheral circulation. Respiratory: Normal respiratory effort without tachypnea nor retractions.  Gastrointestinal: Soft and nontender. No CVA tenderness. Musculoskeletal: No midline cervical, thoracic or lumbar tenderness to palpation. Neurologic:  Normal speech and language. Speech is normal. No gait instability.  Skin:  Skin is warm, dry and intact. No rash noted. Psychiatric: Mood and affect are normal. Speech and behavior are normal. Patient exhibits appropriate  insight and judgment   ___________________________________________   LABS (all labs ordered are listed, but only abnormal results are displayed)  Labs Reviewed  WET PREP, GENITAL - Abnormal; Notable for the following components:      Result Value   Clue Cells Wet Prep HPF POC PRESENT (*)    WBC, Wet Prep HPF POC FEW (*)    All other components within normal limits  URINALYSIS, COMPLETE (UACMP) WITH MICROSCOPIC - Abnormal; Notable for the following components:   APPearance HAZY (*)    Hgb urine dipstick TRACE (*)    Bacteria, UA RARE (*)    All other components within normal limits  CHLAMYDIA/NGC RT PCR ([facility] ONLY)  URINE CULTURE    PROCEDURES Procedures     INITIAL IMPRESSION / ASSESSMENT AND PLAN / ED COURSE  Pertinent labs & imaging results that were available during my care of the patient were reviewed by me and considered in my medical decision making (see chart for details).  Well-appearing patient.  No acute distress.  Urinalysis reviewed, rare bacteria, will culture discussed not clear UTI.  Patient elected for self wet prep and self GC chlamydia.  Wet prep positive for clue cells.  Discussed treatment options with patient, will treat with oral Flagyl.  Pelvic rest, supportive care monitoring.Discussed indication, risks and benefits of medications with patient.   Discussed follow up with Primary care physician this week. Discussed follow up and return parameters including no resolution or any worsening concerns. Patient verbalized understanding and agreed to plan.   ____________________________________________   FINAL CLINICAL IMPRESSION(S) / ED DIAGNOSES  Final diagnoses:  BV (bacterial vaginosis)  Screen for STD (sexually transmitted disease)     ED Discharge Orders         Ordered    metroNIDAZOLE (FLAGYL) 500 MG tablet  2 times daily     Discontinue  Reprint     01/25/20 1346           Note: This dictation was prepared with Dragon  dictation along with smaller phrase technology. Any transcriptional errors that result from this process are unintentional.         Renford Dills, NP 01/25/20 1556

## 2020-01-25 NOTE — Discharge Instructions (Addendum)
Take medication as prescribed.Pelvic rest.   Follow up with your primary care physician this week as needed. Return to Urgent care for new or worsening concerns.

## 2020-01-25 NOTE — ED Triage Notes (Signed)
Patient in today c/o vaginal discharge and odor x 3 weeks. Patient saw PCP ~1 month ago and was treated for yeast. Patient states she got better for 2-3 days, but now having symptoms again. Patient also requesting GC/Chlam testing.

## 2020-01-25 NOTE — ED Triage Notes (Signed)
Patient also c/o burning with urination.

## 2020-01-26 LAB — CHLAMYDIA/NGC RT PCR (ARMC ONLY)
Chlamydia Tr: NOT DETECTED
N gonorrhoeae: NOT DETECTED

## 2020-01-27 LAB — URINE CULTURE: Culture: >=10000 — AB

## 2020-03-15 ENCOUNTER — Encounter: Payer: Self-pay | Admitting: Emergency Medicine

## 2020-03-15 ENCOUNTER — Other Ambulatory Visit: Payer: Self-pay

## 2020-03-15 ENCOUNTER — Ambulatory Visit
Admission: EM | Admit: 2020-03-15 | Discharge: 2020-03-15 | Disposition: A | Discharge status: Home / Self Care | Payer: BLUE CROSS/BLUE SHIELD | Attending: Emergency Medicine | Admitting: Emergency Medicine

## 2020-03-15 ENCOUNTER — Ambulatory Visit
Admission: RE | Admit: 2020-03-15 | Discharge: 2020-03-15 | Discharge status: Absent without leave | Payer: BLUE CROSS/BLUE SHIELD | Source: Ambulatory Visit

## 2020-03-15 DIAGNOSIS — N760 Acute vaginitis: Secondary | ICD-10-CM

## 2020-03-15 DIAGNOSIS — B009 Herpesviral infection, unspecified: Secondary | ICD-10-CM | POA: Insufficient documentation

## 2020-03-15 DIAGNOSIS — B9689 Other specified bacterial agents as the cause of diseases classified elsewhere: Secondary | ICD-10-CM | POA: Diagnosis not present

## 2020-03-15 DIAGNOSIS — Z760 Encounter for issue of repeat prescription: Secondary | ICD-10-CM | POA: Diagnosis not present

## 2020-03-15 LAB — URINALYSIS, COMPLETE (UACMP) WITH MICROSCOPIC
Bilirubin Urine: NEGATIVE
Glucose, UA: NEGATIVE mg/dL
Ketones, ur: NEGATIVE mg/dL
Leukocytes,Ua: NEGATIVE
Nitrite: NEGATIVE
Protein, ur: NEGATIVE mg/dL
Specific Gravity, Urine: 1.015 (ref 1.005–1.030)
pH: 7 (ref 5.0–8.0)

## 2020-03-15 LAB — CHLAMYDIA/NGC RT PCR (ARMC ONLY)
Chlamydia Tr: NOT DETECTED
N gonorrhoeae: NOT DETECTED

## 2020-03-15 LAB — WET PREP, GENITAL
Sperm: NONE SEEN
Trich, Wet Prep: NONE SEEN
Yeast Wet Prep HPF POC: NONE SEEN

## 2020-03-15 LAB — PREGNANCY, URINE: Preg Test, Ur: NEGATIVE

## 2020-03-15 MED ORDER — VALACYCLOVIR HCL 500 MG PO TABS: 500.0000 mg | ORAL_TABLET | Freq: Three times a day (TID) | ORAL | 0 refills | Status: AC

## 2020-03-15 MED ORDER — METRONIDAZOLE 500 MG PO TABS: 500.0000 mg | ORAL_TABLET | Freq: Two times a day (BID) | ORAL | 0 refills | Status: AC

## 2020-03-15 NOTE — Discharge Instructions (Addendum)
Take the medication as written. Give [place] a working phone number so that we can contact you if needed. Refrain from sexual contact until you know your results and your partner(s) are treated if necessary. Return to the ER if you get worse, have a fever >100.4, or for any   Go to www.goodrx.com to look up your medications. This will give you a list of where you can find your prescriptions at the most affordable prices. Or ask the pharmacist what the cash price is, or if they have any other discount programs available to help make your medication more affordable. This can be less expensive than what you would pay with insurance.

## 2020-03-15 NOTE — ED Provider Notes (Signed)
HPI  SUBJECTIVE:  Leah Bender is a 24 y.o. female who presents with dysuria, odorous urine, odorous vaginal discharge for a week and a half.  No urinary urgency, frequency, cloudy urine, hematuria.  No genital rash, vaginal itching.  No nausea, vomiting, fevers, abdominal, back, pelvic pain.  Normal vaginal bleeding.  She is in a new monogamous relationship with a female who is asymptomatic to her knowledge.  She is not sure he is seeing anybody else.  STDs are a concern today.  She states that she took a Plan B in the beginning of August.  No antibiotics in the past month.  No antipyretic in the past 6 hours.  No aggravating or alleviating factors.  She has not tried anything for this.  She has a past medical history of UTI, chlamydia, HSV 1, BV.  No history of pyelonephritis, nephrolithiasis, diabetes, hypertension, gonorrhea, HIV, syphilis, trichomonas, yeast, PID, ectopic pregnancy.  LMP: 1 week ago.  denies the possibility of being pregnant.  PMD: Dr. Algis Downs primary care   Past Medical History:  Diagnosis Date  . Depression     Past Surgical History:  Procedure Laterality Date  . FRACTURE SURGERY Right    ankle  . WISDOM TOOTH EXTRACTION      Family History  Problem Relation Age of Onset  . Rheum arthritis Mother   . Lupus Mother   . Healthy Father     Social History   Tobacco Use  . Smoking status: Former Smoker    Quit date: 06/25/2015    Years since quitting: 4.7  . Smokeless tobacco: Never Used  Vaping Use  . Vaping Use: Former  . Substances: CBD  . Devices: Juul  Substance Use Topics  . Alcohol use: Yes    Comment: social  . Drug use: No    No current facility-administered medications for this encounter.  Current Outpatient Medications:  .  metroNIDAZOLE (FLAGYL) 500 MG tablet, Take 1 tablet (500 mg total) by mouth 2 (two) times daily for 7 days., Disp: 14 tablet, Rfl: 0 .  SUMAtriptan (IMITREX) 50 MG tablet, 1 tablet at the onset of migraine.  Additional dose in 2 hours if headache persists or recurs., Disp: 10 tablet, Rfl: 0 .  valACYclovir (VALTREX) 500 MG tablet, Take 1 tablet (500 mg total) by mouth 3 (three) times daily for 12 doses. Take 1 pill twice a day for 3 days., Disp: 12 tablet, Rfl: 0  No Known Allergies   ROS  As noted in HPI.   Physical Exam  BP 128/80 (BP Location: Right Arm)   Pulse 100   Temp 98.4 F (36.9 C) (Oral)   Resp 18   Ht 5\' 2"  (1.575 m)   Wt 56.7 kg   LMP 03/08/2020 (Approximate)   SpO2 100%   BMI 22.86 kg/m   Constitutional: Well developed, well nourished, no acute distress Eyes:  EOMI, conjunctiva normal bilaterally HENT: Normocephalic, atraumatic,mucus membranes moist Respiratory: Normal inspiratory effort Cardiovascular: Normal rate GI: nondistended soft, nontender.  No right lower quadrant, left lower quadrant tenderness no suprapubic, flank tenderness Back: No CVAT GU: Deferred skin: No rash, skin intact Musculoskeletal: no deformities Neurologic: Alert & oriented x 3, no focal neuro deficits Psychiatric: Speech and behavior appropriate   ED Course   Medications - No data to display  Orders Placed This Encounter  Procedures  . Pelvic exam    Standing Status:   Standing    Number of Occurrences:   1  .  Wet prep, genital    Standing Status:   Standing    Number of Occurrences:   1    Order Specific Question:   Patient immune status    Answer:   Normal  . Chlamydia/NGC rt PCR ([facility] only)    Standing Status:   Standing    Number of Occurrences:   1    Order Specific Question:   Patient immune status    Answer:   Normal    Order Specific Question:   Release to patient    Answer:   Immediate  . Urine culture    Standing Status:   Standing    Number of Occurrences:   1    Order Specific Question:   List patient's active antibiotics    Answer:   none  . Pregnancy, urine    Standing Status:   Standing    Number of Occurrences:   1  . Urinalysis, Complete w  Microscopic    Standing Status:   Standing    Number of Occurrences:   1  . hCG, quantitative, pregnancy    Standing Status:   Standing    Number of Occurrences:   1    Results for orders placed or performed during the hospital encounter of 03/15/20 (from the past 24 hour(s))  Wet prep, genital     Status: Abnormal   Collection Time: 03/15/20  1:26 PM   Specimen: Vaginal  Result Value Ref Range   Yeast Wet Prep HPF POC NONE SEEN NONE SEEN   Trich, Wet Prep NONE SEEN NONE SEEN   Clue Cells Wet Prep HPF POC PRESENT (A) NONE SEEN   WBC, Wet Prep HPF POC FEW (A) NONE SEEN   Sperm NONE SEEN   Pregnancy, urine     Status: None   Collection Time: 03/15/20  1:26 PM  Result Value Ref Range   Preg Test, Ur NEGATIVE NEGATIVE  Urinalysis, Complete w Microscopic     Status: Abnormal   Collection Time: 03/15/20  1:26 PM  Result Value Ref Range   Color, Urine YELLOW YELLOW   APPearance CLEAR CLEAR   Specific Gravity, Urine 1.015 1.005 - 1.030   pH 7.0 5.0 - 8.0   Glucose, UA NEGATIVE NEGATIVE mg/dL   Hgb urine dipstick TRACE (A) NEGATIVE   Bilirubin Urine NEGATIVE NEGATIVE   Ketones, ur NEGATIVE NEGATIVE mg/dL   Protein, ur NEGATIVE NEGATIVE mg/dL   Nitrite NEGATIVE NEGATIVE   Leukocytes,Ua NEGATIVE NEGATIVE   Squamous Epithelial / LPF 11-20 0 - 5   WBC, UA 6-10 0 - 5 WBC/hpf   RBC / HPF 0-5 0 - 5 RBC/hpf   Bacteria, UA FEW (A) NONE SEEN   No results found.  ED Clinical Impression  1. 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 Medication refill      ED Assessment/Plan  UA contaminated.  Will send off culture prior to initiating antibiotic treatment.  She is not pregnant.  She has BV.  She declined a pelvic exam and given the fact that she has no pelvic pain, has no abdominal tenderness, feel that this is reasonable.  Gonorrhea chlamydia sent.  Will defer treatment based on labs.  Patient also requesting a refill on her Valtrex for her HSV  1.  Follow-up with PMD as needed.  Discussed labs, MDM, treatment plan, and plan for follow-up with patient.  patient agrees with plan.   Meds ordered this encounter  Medications  .  metroNIDAZOLE (FLAGYL) 500 MG tablet    Sig: Take 1 tablet (500 mg total) by mouth 2 (two) times daily for 7 days.    Dispense:  14 tablet    Refill:  0  . valACYclovir (VALTREX) 500 MG tablet    Sig: Take 1 tablet (500 mg total) by mouth 3 (three) times daily for 12 doses. Take 1 pill twice a day for 3 days.    Dispense:  12 tablet    Refill:  0    *This clinic note was created using Scientist, clinical (histocompatibility and immunogenetics). Therefore, there may be occasional mistakes despite careful proofreading.   ?    Domenick Gong, MD 03/18/20 [phone]

## 2020-03-15 NOTE — ED Triage Notes (Signed)
Pt c/o lower abdominal pain, white vaginal discharge. Started about a week ago. She states she took a plan B about a week ago also.

## 2020-03-16 LAB — URINE CULTURE: Culture: <10000 — AB

## 2020-07-05 ENCOUNTER — Encounter: Payer: Self-pay | Admitting: Emergency Medicine

## 2020-09-23 ENCOUNTER — Other Ambulatory Visit: Payer: Self-pay

## 2020-09-23 ENCOUNTER — Ambulatory Visit
Admission: RE | Admit: 2020-09-23 | Discharge: 2020-09-23 | Disposition: A | Discharge status: Home / Self Care | Payer: BLUE CROSS/BLUE SHIELD | Source: Ambulatory Visit | Attending: Family Medicine | Admitting: Family Medicine

## 2020-09-23 VITALS — BP 111/72 | HR 89 | Temp 98.8°F | Resp 18 | Ht 62.0 in | Wt 130.0 lb

## 2020-09-23 DIAGNOSIS — Z202 Contact with and (suspected) exposure to infections with a predominantly sexual mode of transmission: Secondary | ICD-10-CM

## 2020-09-23 NOTE — ED Triage Notes (Signed)
Pt c/o potential exposure to genital herpes. Pt states her ex-bf called her and advised her he has HSV2. She is concerned and would like testing. She does have a history of cold sores, she denies any history or symptoms of HSV2.

## 2020-09-23 NOTE — ED Provider Notes (Signed)
[facility]    CSN: [id number] Arrival date & time: 09/23/20  1813      History   Chief Complaint Chief Complaint  Patient presents with  . Exposure to STD   HPI  25 year old female presents with the above complaint.  Patient states that her ex boyfriend informed her today that he slept with another individual who was diagnosed with genital herpes.  Patient states that she recently had unprotected intercourse with him.  She is concerned that she may have genital herpes.  She would like to discuss testing today.  She denies any current symptoms and is feeling well.   Past Medical History:  Diagnosis Date  . Depression    Past Surgical History:  Procedure Laterality Date  . FRACTURE SURGERY Right    ankle  . WISDOM TOOTH EXTRACTION      OB History   No obstetric history on file.      Home Medications    Prior to Admission medications   Medication Sig Start Date End Date Taking? Authorizing Provider  albuterol (VENTOLIN HFA) 108 (90 Base) MCG/ACT inhaler Inhale into the lungs. 02/20/20 02/19/21 Yes [provider]  SUMAtriptan (IMITREX) 50 MG tablet 1 tablet at the onset of migraine. Additional dose in 2 hours if headache persists or recurs. 02/04/18  Yes Amora Sheehy G, DO  fluticasone (FLONASE) 50 MCG/ACT nasal spray Place 2 sprays into both nostrils daily. 11/02/19 03/15/20  Domenick Gong, MD  sertraline (ZOLOFT) 50 MG tablet TAKE 1/2 TABLET DAILY FOR 1 WEEK THEN TAKE 1 TABLET DAILY THEREAFTER 01/09/18 04/21/19  [provider]    Family History Family History  Problem Relation Age of Onset  . Rheum arthritis Mother   . Lupus Mother   . Healthy Father     Social History Social History   Tobacco Use  . Smoking status: Former Smoker    Quit date: 06/25/2015    Years since quitting: 5.2  . Smokeless tobacco: Never Used  Vaping Use  . Vaping Use: Former  . Substances: CBD  . Devices: Juul  Substance Use Topics  . Alcohol use:  Yes    Comment: social  . Drug use: No     Allergies   Patient has no known allergies.   Review of Systems Review of Systems Per HPI  Physical Exam Triage Vital Signs ED Triage Vitals  Enc Vitals Group     BP 09/23/20 1845 111/72     Pulse Rate 09/23/20 1845 89     Resp 09/23/20 1845 18     Temp 09/23/20 1845 98.8 F (37.1 C)     Temp Source 09/23/20 1845 Oral     SpO2 09/23/20 1845 100 %     Weight 09/23/20 1843 130 lb (59 kg)     Height 09/23/20 1843 5\' 2"  (1.575 m)     Head Circumference --      Peak Flow --      Pain Score 09/23/20 1843 0     Pain Loc --      Pain Edu? --      Excl. in GC? --    Updated Vital Signs BP 111/72 (BP Location: Left Arm)   Pulse 89   Temp 98.8 F (37.1 C) (Oral)   Resp 18   Ht 5\' 2"  (1.575 m)   Wt 59 kg   LMP 09/12/2020 (Approximate)   SpO2 100%   BMI 23.78 kg/m   Visual Acuity Right Eye Distance:  Left Eye Distance:   Bilateral Distance:    Right Eye Near:   Left Eye Near:    Bilateral Near:     Physical Exam Vitals and nursing note reviewed.  Constitutional:      General: She is not in acute distress.    Appearance: Normal appearance. She is not ill-appearing.  HENT:     Head: Normocephalic and atraumatic.  Eyes:     General:        Right eye: No discharge.        Left eye: No discharge.     Conjunctiva/sclera: Conjunctivae normal.  Pulmonary:     Effort: Pulmonary effort is normal. No respiratory distress.  Neurological:     Mental Status: She is alert.  Psychiatric:        Mood and Affect: Mood normal.        Behavior: Behavior normal.    UC Treatments / Results  Labs (all labs ordered are listed, but only abnormal results are displayed) Labs Reviewed - No data to display  EKG   Radiology No results found.  Procedures Procedures (including critical care time)  Medications Ordered in UC Medications - No data to display  Initial Impression / Assessment and Plan / UC Course  I have  reviewed the triage vital signs and the nursing notes.  Pertinent labs & imaging results that were available during my care of the patient were reviewed by me and considered in my medical decision making (see chart for details).    25 year old female presents with possible exposure to STD.  Patient here for possible genital herpes exposure.  We discussed the merits of antibody testing.  After lengthy discussion, she decided against antibody testing given the lack of significant clinical benefit.  I advised her to return if she develops symptoms as swabbing a genital sore would be the ideal way to confirm diagnosis.  She is currently asymptomatic and doing well.  I have advised her to practice safe sex.  Supportive care.  Final Clinical Impressions(s) / UC Diagnoses   Final diagnoses:  Possible exposure to STD     Discharge Instructions     Keep a close eye.  If you develop symptoms, return and we will exam and swab.  Practice safe sex.  Take care  Dr. Adriana Simas    ED Prescriptions    None     PDMP not reviewed this encounter.   Tommie Sams, [place] 09/23/20 1929

## 2020-09-23 NOTE — Discharge Instructions (Signed)
Keep a close eye.  If you develop symptoms, return and we will exam and swab.  Practice safe sex.  Take care  Dr. Adriana Simas

## 2020-10-19 ENCOUNTER — Ambulatory Visit: Payer: Self-pay

## 2020-11-01 ENCOUNTER — Ambulatory Visit: Payer: Self-pay

## 2024-02-07 ENCOUNTER — Ambulatory Visit

## 2024-02-07 ENCOUNTER — Ambulatory Visit
Admission: EM | Admit: 2024-02-07 | Discharge: 2024-02-07 | Disposition: A | Discharge status: Home / Self Care | Attending: Family Medicine | Admitting: Family Medicine

## 2024-02-07 ENCOUNTER — Other Ambulatory Visit: Payer: Self-pay

## 2024-02-07 ENCOUNTER — Ambulatory Visit (INDEPENDENT_AMBULATORY_CARE_PROVIDER_SITE_OTHER)

## 2024-02-07 DIAGNOSIS — Z23 Encounter for immunization: Secondary | ICD-10-CM | POA: Diagnosis not present

## 2024-02-07 DIAGNOSIS — S61215A Laceration without foreign body of left ring finger without damage to nail, initial encounter: Secondary | ICD-10-CM

## 2024-02-07 MED ORDER — AMOXICILLIN-POT CLAVULANATE 875-125 MG PO TABS: 1.0000 | ORAL_TABLET | Freq: Two times a day (BID) | ORAL | 0 refills | Status: AC

## 2024-02-07 MED ORDER — FLUCONAZOLE 150 MG PO TABS: 150.0000 mg | ORAL_TABLET | Freq: Once | ORAL | 0 refills | Status: AC

## 2024-02-07 MED ORDER — TETANUS-DIPHTH-ACELL PERTUSSIS 5-2.5-18.5 LF-MCG/0.5 IM SUSY
0.5000 mL | PREFILLED_SYRINGE | Freq: Once | INTRAMUSCULAR | Status: AC
  Administered 2024-02-07: 0.5 mL via INTRAMUSCULAR

## 2024-02-07 NOTE — ED Provider Notes (Signed)
 [facility]    CSN: [id number] Arrival date & time: 02/07/24  0931      History   Chief Complaint Chief Complaint  Patient presents with   Animal Bite    HPI Leah Bender is a 28 y.o. female.   HPI  Leah Bender presents for bitten by her dog last night around 7 PM.  Her dog was choking on a bone and her dog was choking on it. Says the dog's tongue was purple and was gasping for air.  After retrieving the bone the dog went to close her mouth and bit Leah Bender.  The dogs vaccines are UTD. Last went to the vet on 01/22/24.    Has bite to left ring finger with bleeding.  She wrapped it up tight.  She didn't want to go the ED last night as she didn't want the bill.  Those she could take care of it herself but the wound keeps bleeding.   She is right handed.     Past Medical History:  Diagnosis Date   Depression     There are no active problems to display for this patient.   Past Surgical History:  Procedure Laterality Date   FRACTURE SURGERY Right    ankle   WISDOM TOOTH EXTRACTION      OB History   No obstetric history on file.      Home Medications    Prior to Admission medications   Medication Sig Start Date End Date Taking? Authorizing Provider  amoxicillin-clavulanate (AUGMENTIN) 875-125 MG tablet Take 1 tablet by mouth every 12 (twelve) hours. 02/07/24  Yes Goldye Tourangeau, DO  fluconazole  (DIFLUCAN ) 150 MG tablet Take 1 tablet (150 mg total) by mouth once for 1 dose. 02/07/24 02/07/24 Yes Zakari Bathe, DO  gabapentin (NEURONTIN) 100 MG capsule Take 100 mg by mouth at bedtime.   Yes [provider]  hydrOXYzine (ATARAX) 25 MG tablet TAKE 1 TABLET (25 MG TOTAL) BY MOUTH 3 TIMES A DAY 12/16/23  Yes [provider]  SUMAtriptan  (IMITREX ) 50 MG tablet 1 tablet at the onset of migraine. Additional dose in 2 hours if headache persists or recurs. 02/04/18  Yes Cook, Jayce G, DO  albuterol (VENTOLIN HFA) 108 (90 Base) MCG/ACT inhaler Inhale into the  lungs. 02/20/20 02/19/21  [provider]  buPROPion (WELLBUTRIN XL) 150 MG 24 hr tablet Take 150 mg by mouth every morning.    [provider]  fluticasone  (FLONASE ) 50 MCG/ACT nasal spray Place 2 sprays into both nostrils daily. 11/02/19 03/15/20  Van Knee, MD  sertraline (ZOLOFT) 50 MG tablet TAKE 1/2 TABLET DAILY FOR 1 WEEK THEN TAKE 1 TABLET DAILY THEREAFTER 01/09/18 04/21/19  [provider]    Family History Family History  Problem Relation Age of Onset   Rheum arthritis Mother    Lupus Mother    Healthy Father     Social History Social History   Tobacco Use   Smoking status: Former    Current packs/day: 0.00    Types: Cigarettes    Quit date: 06/25/2015    Years since quitting: 8.6   Smokeless tobacco: Never  Vaping Use   Vaping status: Former   Substances: CBD   Devices: Juul  Substance Use Topics   Alcohol use: Yes    Comment: social   Drug use: No     Allergies   Ibuprofen    Review of Systems Review of Systems :negative unless otherwise stated in HPI.  Physical Exam Triage Vital Signs ED Triage Vitals  Encounter Vitals Group     BP 02/07/24 1001 114/65     Girls Systolic BP Percentile --      Girls Diastolic BP Percentile --      Boys Systolic BP Percentile --      Boys Diastolic BP Percentile --      Pulse Rate 02/07/24 1001 75     Resp 02/07/24 1001 17     Temp 02/07/24 1001 99.2 F (37.3 C)     Temp Source 02/07/24 1001 Oral     SpO2 02/07/24 1001 100 %     Weight --      Height --      Head Circumference --      Peak Flow --      Pain Score 02/07/24 1000 8     Pain Loc --      Pain Education --      Exclude from Growth Chart --    No data found.  Updated Vital Signs BP 114/65 (BP Location: Left Arm)   Pulse 75   Temp 99.2 F (37.3 C) (Oral)   Resp 17   LMP 01/26/2024 (Approximate)   SpO2 100%   Visual Acuity Right Eye Distance:   Left Eye Distance:   Bilateral Distance:    Right Eye  Near:   Left Eye Near:    Bilateral Near:     Physical Exam  GEN: alert, well appearing female, in no acute distress  EYES: no scleral injection or discharge CV: regular rate, strong radial pulse  RESP: no increased work of breathing MSK: Left Hand: Inspection: No obvious deformity. Minimal bleeding with distal bruising. No swelling, erythema or bruising b/l Palpation:  left distal ring finger TTP  ROM: Full ROM of the digits and wrist b/l. No swelling in PIP, DIP joints b/l. Flexor digitorum profundus and superficialis tendon functions are intact.  PIP joint collateral ligaments are stable  Strength: 5/5 strength in the forearm, wrist and interosseus muscles b/l Neurovascular: NV intact b/l NEURO: alert, moves all extremities appropriately SKIN: warm and dry; left ring finger laceration near DIP joint   UC Treatments / Results  Labs (all labs ordered are listed, but only abnormal results are displayed) Labs Reviewed - No data to display  EKG   Radiology DG Finger Ring Left Result Date: 02/07/2024 CLINICAL DATA:  Dog bite EXAM: LEFT RING FINGER 2+V COMPARISON:  None Available. FINDINGS: No fracture dislocation of the fourth digit. No foreign body identified. IMPRESSION: No fracture or foreign body. Electronically Signed   By: Jackquline Boxer M.D.   On: 02/07/2024 11:26    Procedures Procedures (including critical care time)  Medications Ordered in UC Medications  Tdap (BOOSTRIX) injection 0.5 mL (0.5 mLs Intramuscular Given 02/07/24 1125)    Initial Impression / Assessment and Plan / UC Course  I have reviewed the triage vital signs and the nursing notes.  Pertinent labs & imaging results that were available during my care of the patient were reviewed by me and considered in my medical decision making (see chart for details).    Dog bite Patient is a 28 y.o. female who presents for dog bite that occurred 15 hours ago.  Left ring finger x-ray obtained to assess assess  for any possible bony involvement.  No fracture or dislocation seen. Radiologist impression reviewed.  Wounds cleansed and covered with sterile dressing and steri strips. Topical antibiotic applied.  Open lacerations to heal  by secondary intention.   This is a  known animal whose vaccines are up-to-date.  No need for rabies vaccine or immunoglobulin needed at this time. Tetanus is not UTD as was last given in May 2009 therefore was given today).  Over-the-counter analgesics as needed. Treat with Augmentin. Diflucan  for antibiotic associated yeast infection prevention.  Advised to watch for signs of infection. Pt to return for subsequent vaccine doses on day 3,7, and 14.    Reviewed expectations regarding course of current medical issues.  All questions asked were answered.  Outlined signs and symptoms indicating need for more acute intervention. Patient verbalized understanding. After Visit Summary given.   Final Clinical Impressions(s) / UC Diagnoses   Final diagnoses:  Laceration of left ring finger without foreign body without damage to nail, initial encounter     Discharge Instructions      See handout on how to care for your wound at home. You were given a tetanus vaccine today.   Stop by the pharmacy to pick up your prescriptions.  Follow up with your primary care provider or return to the urgent care, if not improving.       ED Prescriptions     Medication Sig Dispense Auth. Provider   amoxicillin-clavulanate (AUGMENTIN) 875-125 MG tablet Take 1 tablet by mouth every 12 (twelve) hours. 14 tablet Jamaul Heist, DO   fluconazole  (DIFLUCAN ) 150 MG tablet Take 1 tablet (150 mg total) by mouth once for 1 dose. 1 tablet Kriste Berth, DO      PDMP not reviewed this encounter.              Kriste Berth, DO 02/07/24 1207

## 2024-02-07 NOTE — Discharge Instructions (Addendum)
 See handout on how to care for your wound at home. You were given a tetanus vaccine today.   Stop by the pharmacy to pick up your prescriptions.  Follow up with your primary care provider or return to the urgent care, if not improving.

## 2024-02-07 NOTE — ED Triage Notes (Addendum)
 Patient states that this is her dog bit her . UTD on shots. No need for rabies. She was pulling a bone from the dogs throat and the dog bit down. Unknown last t-dap   Right ring finger
